# Patient Record
Sex: Male | Born: 1965 | Race: White | Hispanic: No | Marital: Single | Smoking: Current some day smoker
Health system: Southern US, Community
[De-identification: ages and names within clinical notes are randomized; demographics above are authoritative.]

## PROBLEM LIST (undated history)

## (undated) DIAGNOSIS — K5792 Diverticulitis of intestine, part unspecified, without perforation or abscess without bleeding: Secondary | ICD-10-CM

---

## 2007-06-27 HISTORY — PX: ABDOMINAL SURGERY: SHX537

## 2018-04-22 ENCOUNTER — Emergency Department (HOSPITAL_COMMUNITY): Payer: Self-pay

## 2018-04-22 ENCOUNTER — Inpatient Hospital Stay (HOSPITAL_COMMUNITY)
Admission: EM | Admit: 2018-04-22 | Discharge: 2018-04-24 | DRG: 392 | Discharge status: Against Medical Advice | Payer: Self-pay | Attending: Internal Medicine | Admitting: Internal Medicine

## 2018-04-22 ENCOUNTER — Encounter (HOSPITAL_COMMUNITY): Payer: Self-pay | Admitting: *Deleted

## 2018-04-22 ENCOUNTER — Other Ambulatory Visit: Payer: Self-pay

## 2018-04-22 DIAGNOSIS — M5430 Sciatica, unspecified side: Secondary | ICD-10-CM | POA: Diagnosis present

## 2018-04-22 DIAGNOSIS — F22 Delusional disorders: Secondary | ICD-10-CM | POA: Diagnosis present

## 2018-04-22 DIAGNOSIS — Z91018 Allergy to other foods: Secondary | ICD-10-CM

## 2018-04-22 DIAGNOSIS — R739 Hyperglycemia, unspecified: Secondary | ICD-10-CM | POA: Diagnosis present

## 2018-04-22 DIAGNOSIS — K630 Abscess of intestine: Secondary | ICD-10-CM

## 2018-04-22 DIAGNOSIS — R319 Hematuria, unspecified: Secondary | ICD-10-CM | POA: Diagnosis present

## 2018-04-22 DIAGNOSIS — F17210 Nicotine dependence, cigarettes, uncomplicated: Secondary | ICD-10-CM | POA: Diagnosis present

## 2018-04-22 DIAGNOSIS — K5720 Diverticulitis of large intestine with perforation and abscess without bleeding: Principal | ICD-10-CM | POA: Diagnosis present

## 2018-04-22 DIAGNOSIS — Z9049 Acquired absence of other specified parts of digestive tract: Secondary | ICD-10-CM

## 2018-04-22 DIAGNOSIS — Z885 Allergy status to narcotic agent status: Secondary | ICD-10-CM

## 2018-04-22 DIAGNOSIS — F17200 Nicotine dependence, unspecified, uncomplicated: Secondary | ICD-10-CM | POA: Diagnosis present

## 2018-04-22 HISTORY — DX: Diverticulitis of intestine, part unspecified, without perforation or abscess without bleeding: K57.92

## 2018-04-22 LAB — URINALYSIS, ROUTINE W REFLEX MICROSCOPIC
BILIRUBIN URINE: NEGATIVE
GLUCOSE, UA: NEGATIVE mg/dL
KETONES UR: NEGATIVE mg/dL
LEUKOCYTES UA: NEGATIVE
Nitrite: NEGATIVE
PH: 5 (ref 5.0–8.0)
PROTEIN: NEGATIVE mg/dL
Specific Gravity, Urine: 1.021 (ref 1.005–1.030)

## 2018-04-22 LAB — COMPREHENSIVE METABOLIC PANEL
ALK PHOS: 70 U/L (ref 38–126)
ALT: 30 U/L (ref 0–44)
ANION GAP: 9 (ref 5–15)
AST: 22 U/L (ref 15–41)
Albumin: 3.7 g/dL (ref 3.5–5.0)
BUN: 13 mg/dL (ref 6–20)
CALCIUM: 9.4 mg/dL (ref 8.9–10.3)
CO2: 25 mmol/L (ref 22–32)
Chloride: 104 mmol/L (ref 98–111)
Creatinine, Ser: 0.91 mg/dL (ref 0.61–1.24)
GFR calc non Af Amer: >60 mL/min (ref >60)
Glucose, Bld: 112 mg/dL — ABNORMAL HIGH (ref 70–99)
Potassium: 3.6 mmol/L (ref 3.5–5.1)
SODIUM: 138 mmol/L (ref 135–145)
TOTAL PROTEIN: 7.1 g/dL (ref 6.5–8.1)
Total Bilirubin: 0.7 mg/dL (ref 0.3–1.2)

## 2018-04-22 LAB — RAPID URINE DRUG SCREEN, HOSP PERFORMED
Amphetamines: NOT DETECTED
Barbiturates: NOT DETECTED
Benzodiazepines: NOT DETECTED
Cocaine: NOT DETECTED
Opiates: NOT DETECTED
TETRAHYDROCANNABINOL: POSITIVE — AB

## 2018-04-22 LAB — CBC
HCT: 43 % (ref 39.0–52.0)
Hemoglobin: 14.1 g/dL (ref 13.0–17.0)
MCH: 30.7 pg (ref 26.0–34.0)
MCHC: 32.8 g/dL (ref 30.0–36.0)
MCV: 93.5 fL (ref 80.0–100.0)
PLATELETS: 287 10*3/uL (ref 150–400)
RBC: 4.6 MIL/uL (ref 4.22–5.81)
RDW: 12.5 % (ref 11.5–15.5)
WBC: 13.9 10*3/uL — ABNORMAL HIGH (ref 4.0–10.5)
nRBC: 0 % (ref 0.0–0.2)

## 2018-04-22 LAB — LIPASE, BLOOD: Lipase: 26 U/L (ref 11–51)

## 2018-04-22 LAB — POC OCCULT BLOOD, ED: Fecal Occult Bld: NEGATIVE

## 2018-04-22 MED ORDER — FENTANYL CITRATE (PF) 100 MCG/2ML IJ SOLN
50.0000 ug | Freq: Once | INTRAMUSCULAR | Status: AC
  Administered 2018-04-22: 50 ug via INTRAVENOUS
  Filled 2018-04-22: qty 2

## 2018-04-22 MED ORDER — PIPERACILLIN-TAZOBACTAM 3.375 G IVPB 30 MIN
3.3750 g | Freq: Once | INTRAVENOUS | Status: AC
  Administered 2018-04-22: 3.375 g via INTRAVENOUS
  Filled 2018-04-22: qty 50

## 2018-04-22 MED ORDER — PIPERACILLIN-TAZOBACTAM 3.375 G IVPB 30 MIN: 3.3750 g | Freq: Three times a day (TID) | INTRAVENOUS | Status: DC

## 2018-04-22 MED ORDER — IOHEXOL 300 MG/ML  SOLN
100.0000 mL | Freq: Once | INTRAMUSCULAR | Status: AC | PRN
  Administered 2018-04-22: 100 mL via INTRAVENOUS

## 2018-04-22 MED ORDER — LACTATED RINGERS IV SOLN
INTRAVENOUS | Status: DC
  Administered 2018-04-22 – 2018-04-24 (×4): via INTRAVENOUS

## 2018-04-22 MED ORDER — ONDANSETRON HCL 4 MG/2ML IJ SOLN
4.0000 mg | Freq: Four times a day (QID) | INTRAMUSCULAR | Status: DC | PRN
  Administered 2018-04-22 – 2018-04-23 (×2): 4 mg via INTRAVENOUS
  Filled 2018-04-22: qty 2

## 2018-04-22 MED ORDER — ONDANSETRON HCL 4 MG PO TABS: 4.0000 mg | ORAL_TABLET | Freq: Four times a day (QID) | ORAL | Status: DC | PRN

## 2018-04-22 MED ORDER — ONDANSETRON HCL 4 MG/2ML IJ SOLN
4.0000 mg | Freq: Once | INTRAMUSCULAR | Status: AC
  Administered 2018-04-22: 4 mg via INTRAVENOUS
  Filled 2018-04-22: qty 2

## 2018-04-22 MED ORDER — MORPHINE SULFATE (PF) 2 MG/ML IV SOLN
2.0000 mg | INTRAVENOUS | Status: DC | PRN
  Administered 2018-04-22 – 2018-04-23 (×6): 2 mg via INTRAVENOUS
  Filled 2018-04-22 (×7): qty 1

## 2018-04-22 MED ORDER — ACETAMINOPHEN 650 MG RE SUPP: 650.0000 mg | Freq: Four times a day (QID) | RECTAL | Status: DC | PRN

## 2018-04-22 MED ORDER — SODIUM CHLORIDE 0.9 % IV BOLUS
1000.0000 mL | Freq: Once | INTRAVENOUS | Status: AC
  Administered 2018-04-22: 1000 mL via INTRAVENOUS

## 2018-04-22 MED ORDER — ACETAMINOPHEN 325 MG PO TABS
650.0000 mg | ORAL_TABLET | Freq: Four times a day (QID) | ORAL | Status: DC | PRN
  Administered 2018-04-22: 650 mg via ORAL
  Filled 2018-04-22: qty 2

## 2018-04-22 MED ORDER — PIPERACILLIN-TAZOBACTAM 3.375 G IVPB
3.3750 g | Freq: Three times a day (TID) | INTRAVENOUS | Status: DC
  Administered 2018-04-22 – 2018-04-24 (×5): 3.375 g via INTRAVENOUS
  Filled 2018-04-22 (×6): qty 50

## 2018-04-22 NOTE — H&P (Addendum)
History and Physical   Deferred admission to surgery at this time - no known PMH   Jeffrey Harrell ZOX:[id number] DOB: 12-Mar-1966 DOA: 04/22/2018  PCP: Patient, No Pcp Per - last saw a doctor as a child Consultants:  None Patient coming from:  Home - lives with [place]; Jackey LogeMarina Gravel, [phone]  Chief Complaint:  abdominal pain  HPI: Jeffrey Harrell is a 52 y.o. male with no known past medical history presenting with abdominal pain.  Abdominal pain, hot/cold, "toxins getting into my blood stream from it.  Just diverticulitis basically.  When I drink water, it passes by it and I feel like I might pass out.  It have a high tolerance to pain... But THIS, this ain;'t nothing to play with.  It feels literally like someone's drilled a hole in there and pulled acid.  He had abdominal surgery 10 years ago "and they said it was a mistake" afterwards.  "Plus I was a donor, I don't know if that had anything to do with it.  They took my intestines and used it for someone else.  There was some other part, only 2% of folks have some connection, some other part like a small cucumber that aids in digestion and they took it too."  Abdominal pain started Thursday, but had intermittent pain for years.  He went to [place] a year ago and they wouldn't give him pain medicine, "wouldn't treat me right", wouldn't give him an antibiotic.  He reports that he almost died after discharge.  He is having RLQ pain.  Subjective fevers "because I eat a lot of stuff to keep the toxins at bay."  He changed herbs to prevent bleeding but then it hurt too.  He thinks his ex-wife had something to do with the prior surgery, since she worked for those doctors and then had a drink with them the night before and "they comped the surgery."  "And then she faked her death... And I got really upset... I don't know why they'd do that... She has a criminal record now."   ED Course:  Diverticular abscess.  Recurrent episodes but "afraid to go to  the doctor."  Had dark and bright red blood in stools, resolved since onset of symptoms 5 years ago.  ?h/o colectomy, ?sold his organs to his ex-wife.  Surgery prefers to consult only.  Review of Systems: As per HPI; otherwise review of systems reviewed and negative.   Ambulatory Status:  Ambulates without assistance  Past Medical History:  Diagnosis Date  . Diverticulitis     Past Surgical History:  Procedure Laterality Date  . ABDOMINAL SURGERY  2009    Social History   Socioeconomic History  . Marital status: Single    Spouse name: Not on file  . Number of children: Not on file  . Years of education: Not on file  . Highest education level: Not on file  Occupational History  . Not on file  Social Needs  . Financial resource strain: Not on file  . Food insecurity:    Worry: Not on file    Inability: Not on file  . Transportation needs:    Medical: Not on file    Non-medical: Not on file  Tobacco Use  . Smoking status: Current Some Day Smoker  . Smokeless tobacco: Never Used  Substance and Sexual Activity  . Alcohol use: Yes    Comment: occ  . Drug use: Yes    Types: Marijuana    Comment: last use  Saturday  . Sexual activity: Not on file  Lifestyle  . Physical activity:    Days per week: Not on file    Minutes per session: Not on file  . Stress: Not on file  Relationships  . Social connections:    Talks on phone: Not on file    Gets together: Not on file    Attends religious service: Not on file    Active member of club or organization: Not on file    Attends meetings of clubs or organizations: Not on file    Relationship status: Not on file  . Intimate partner violence:    Fear of current or ex partner: Not on file    Emotionally abused: Not on file    Physically abused: Not on file    Forced sexual activity: Not on file  Other Topics Concern  . Not on file  Social History Narrative  . Not on file    Allergies  Allergen Reactions  . Horseradish  [Armoracia Rusticana Ext (Horseradish)] Shortness Of Breath and Rash  . Hydrocodone     History reviewed. No pertinent family history.  Prior to Admission medications   Not on File    Physical Exam: Vitals:   04/22/18 1411 04/22/18 1412 04/22/18 1500 04/22/18 1515  BP:   123/83 123/82  Pulse:   72 73  Resp:      Temp: 99.1 F (37.3 C) 99.4 F (37.4 C)    TempSrc: Oral Rectal    SpO2:   100% 97%     General: Appears calm and comfortable and is NAD Eyes:  PERRL, EOMI, normal lids, iris ENT:  grossly normal hearing, lips & tongue, mmm Neck:  no LAD, masses or thyromegaly Cardiovascular:  RRR, no m/r/g. No LE edema.  Respiratory:   CTA bilaterally with no wheezes/rales/rhonchi.  Normal respiratory effort. Abdomen:  soft, NT, ND, NABS Skin:  no rash or induration seen on limited exam Musculoskeletal:  grossly normal tone BUE/BLE, good ROM, no bony abnormality Lower extremity:  No LE edema.  Limited foot exam with no ulcerations.  2+ distal pulses. Psychiatric:  grossly normal mood and affect, speech fluent and appropriate but somewhat delusional, AOx3 Neurologic:  CN 2-12 grossly intact, moves all extremities in coordinated fashion, sensation intact    Radiological Exams on Admission: Ct Abdomen Pelvis W Contrast  Result Date: 04/22/2018 CLINICAL DATA:  Lower abdominal pain, nausea, vomiting. Occasional bloody stool. EXAM: CT ABDOMEN AND PELVIS WITH CONTRAST TECHNIQUE: Multidetector CT imaging of the abdomen and pelvis was performed using the standard protocol following bolus administration of intravenous contrast. CONTRAST:  OMNIPAQUE IOHEXOL 300 MG/ML  SOLN COMPARISON:  None. FINDINGS: Lower chest: Lung bases are clear. No effusions. Heart is normal size. Hepatobiliary: Scattered low-density lesions throughout the liver most compatible with cysts. No biliary duct dilatation. Gallbladder unremarkable. Pancreas: No focal abnormality or ductal dilatation. Spleen: No focal  abnormality.  Normal size. Adrenals/Urinary Tract: No adrenal abnormality. No focal renal abnormality. No stones or hydronephrosis. Urinary bladder is unremarkable. Stomach/Bowel: Sigmoid diverticulosis. Wall thickening throughout the sigmoid colon. There is inflammation and focal gas and fluid collection along the right lateral sigmoid wall compatible with active diverticulitis with associated abscess. This measures 4.7 x 2.6 cm on image 58 and may extend into the right lateral sigmoid colon wall. Mildly prominent small bowel loops in the left abdomen, likely focal ileus. Stomach unremarkable. Vascular/Lymphatic: No evidence of aneurysm or adenopathy. Reproductive: No visible focal abnormality. Other: No free  fluid or free air. Musculoskeletal: No acute bony abnormality. IMPRESSION: Diffuse sigmoid diverticulosis with changes of active diverticulitis. There is a gas and fluid collection noted along the right lateral sigmoid wall extending into the right lower quadrant measuring 4.7 x 2.6 cm compatible with diverticular abscess. Electronically Signed   By: Charlett Nose M.D.   On: 04/22/2018 10:40    EKG: not done   Labs on Admission: I have personally reviewed the available labs and imaging studies at the time of the admission.  Pertinent labs:   Glucose 112 CMP otherwise WNL WBC 13.9 CBC otherwise WNL UA: large Hgb, rare bacteria, otherwise WNL Heme negative  Assessment/Plan Principal Problem:   Colonic diverticular abscess Active Problems:   Hyperglycemia   Hematuria   Diverticulitis with abscess -Patient's symptoms are c/w diverticulitis and his CT supports this as a diagnosis -Unfortunately, he appears to have a large abscess in the RLQ -His only SIRS criteria is leukocytosis (13.9) -IR has been consulted to attempt to drain the abscess tomorrow -Surgery has been consulted and notes that if he fails conservative management he would likely need colectomy and colostomy -For now, will  give bowel rest, IVF, pain medication with morphine, nausea medication with Zofran, and treat with Zosyn for intraabdominal infection -Will attempt to obtain prior records from [facility]  Hyperglycemia -May be stress response -Will follow with fasting AM labs -It is unlikely that he will need acute or chronic treatment for this issue at this time  Hematuria -Uncertain etiology -Suggest outpatient PCP f/u (needs PCP)  DVT prophylaxis:  SCDs Code Status:  Full - confirmed with patient/family Family Communication: Steffanie Rainwater present throughout evaluation  Disposition Plan:  Home once clinically improved Consults called: Surgery; IR  Admission status: Admit - It is my clinical opinion that admission to INPATIENT is reasonable and necessary because of the expectation that this patient will require hospital care that crosses at least 2 midnights to treat this condition based on the medical complexity of the problems presented.  Given the aforementioned information, the predictability of an adverse outcome is felt to be significant.    Jonah Blue MD [facility]  If note is complete, please contact covering daytime or nighttime physician. [email or web address] Password [email or web address]  04/22/2018, 6:18 PM

## 2018-04-22 NOTE — Consult Note (Signed)
Chief Complaint: Patient was seen in consultation today for diverticular abscess  Referring Physician(s): CCS  Supervising Physician: Ruel Favors  Patient Status: [facility] - In-pt  History of Present Illness: Jeffrey Harrell is a 52 y.o. male with past medical history of diverticulitis with recurrent symptoms over the past 5-10 years.  Patient presented to [facility] ED with abdominal pain which has been present since Wednesday night.  He tried treating at home, however pain worsened and he presented for further evaluation.    CT Abdomen/Pelvis 04/22/18 showed: Diffuse sigmoid diverticulosis with changes of active diverticulitis. There is a gas and fluid collection noted along the right lateral sigmoid wall extending into the right lower quadrant measuring 4.7 x 2.6 cm compatible with diverticular abscess.  IR consulted for aspiration and drainage at the request of general surgery.   Past Medical History:  Diagnosis Date  . Diverticulitis     Past Surgical History:  Procedure Laterality Date  . ABDOMINAL SURGERY  2009    Allergies: Horseradish [armoracia rusticana ext (horseradish)] and Hydrocodone  Medications: Prior to Admission medications   Medication Sig Start Date End Date Taking? Authorizing Provider  ibuprofen (ADVIL,MOTRIN) 200 MG tablet Take 600 mg by mouth every 6 (six) hours as needed for moderate pain.   Yes [provider]  OIL OF OREGANO PO Take 10 drops by mouth as needed (for infection).   Yes [provider]     History reviewed. No pertinent family history.  Social History   Socioeconomic History  . Marital status: Single    Spouse name: Not on file  . Number of children: Not on file  . Years of education: Not on file  . Highest education level: Not on file  Occupational History  . Not on file  Social Needs  . Financial resource strain: Not on file  . Food insecurity:    Worry: Not on file    Inability: Not on file  .  Transportation needs:    Medical: Not on file    Non-medical: Not on file  Tobacco Use  . Smoking status: Current Some Day Smoker  . Smokeless tobacco: Never Used  Substance and Sexual Activity  . Alcohol use: Yes    Comment: occ  . Drug use: Yes    Types: Marijuana    Comment: last use Saturday  . Sexual activity: Not on file  Lifestyle  . Physical activity:    Days per week: Not on file    Minutes per session: Not on file  . Stress: Not on file  Relationships  . Social connections:    Talks on phone: Not on file    Gets together: Not on file    Attends religious service: Not on file    Active member of club or organization: Not on file    Attends meetings of clubs or organizations: Not on file    Relationship status: Not on file  Other Topics Concern  . Not on file  Social History Narrative  . Not on file     Review of Systems: A 12 point ROS discussed and pertinent positives are indicated in the HPI above.  All other systems are negative.  Review of Systems  Constitutional: Negative for fatigue and fever.  Respiratory: Negative for cough and shortness of breath.   Cardiovascular: Negative for chest pain.  Gastrointestinal: Positive for abdominal pain and nausea. Negative for diarrhea and vomiting.  Genitourinary: Negative for dysuria.  Musculoskeletal: Negative for back pain.  Psychiatric/Behavioral: Negative for behavioral problems and confusion.    Vital Signs: BP 129/86   Pulse 73   Temp 99.4 F (37.4 C) (Rectal)   Resp 18   SpO2 100%   Physical Exam  Constitutional: He is oriented to person, place, and time. He appears well-developed. No distress.  Cardiovascular: Normal rate, regular rhythm and normal heart sounds.  Pulmonary/Chest: Effort normal and breath sounds normal.  Abdominal: Soft. He exhibits no distension. There is tenderness (RLQ).  Neurological: He is alert and oriented to person, place, and time.  Skin: Skin is warm and dry.    Psychiatric: He has a normal mood and affect. His behavior is normal. Judgment and thought content normal.  Nursing note and vitals reviewed.    MD Evaluation Airway: WNL Heart: WNL Abdomen: WNL Chest/ Lungs: WNL ASA  Classification: 3 Mallampati/Airway Score: One   Imaging: Ct Abdomen Pelvis W Contrast  Result Date: 04/22/2018 CLINICAL DATA:  Lower abdominal pain, nausea, vomiting. Occasional bloody stool. EXAM: CT ABDOMEN AND PELVIS WITH CONTRAST TECHNIQUE: Multidetector CT imaging of the abdomen and pelvis was performed using the standard protocol following bolus administration of intravenous contrast. CONTRAST:  OMNIPAQUE IOHEXOL 300 MG/ML  SOLN COMPARISON:  None. FINDINGS: Lower chest: Lung bases are clear. No effusions. Heart is normal size. Hepatobiliary: Scattered low-density lesions throughout the liver most compatible with cysts. No biliary duct dilatation. Gallbladder unremarkable. Pancreas: No focal abnormality or ductal dilatation. Spleen: No focal abnormality.  Normal size. Adrenals/Urinary Tract: No adrenal abnormality. No focal renal abnormality. No stones or hydronephrosis. Urinary bladder is unremarkable. Stomach/Bowel: Sigmoid diverticulosis. Wall thickening throughout the sigmoid colon. There is inflammation and focal gas and fluid collection along the right lateral sigmoid wall compatible with active diverticulitis with associated abscess. This measures 4.7 x 2.6 cm on image 58 and may extend into the right lateral sigmoid colon wall. Mildly prominent small bowel loops in the left abdomen, likely focal ileus. Stomach unremarkable. Vascular/Lymphatic: No evidence of aneurysm or adenopathy. Reproductive: No visible focal abnormality. Other: No free fluid or free air. Musculoskeletal: No acute bony abnormality. IMPRESSION: Diffuse sigmoid diverticulosis with changes of active diverticulitis. There is a gas and fluid collection noted along the right lateral sigmoid wall  extending into the right lower quadrant measuring 4.7 x 2.6 cm compatible with diverticular abscess. Electronically Signed   By: Charlett Nose M.D.   On: 04/22/2018 10:40    Labs:  CBC: Recent Labs    04/22/18 0820  WBC 13.9*  HGB 14.1  HCT 43.0  PLT 287    COAGS: No results for input(s): INR, APTT in the last 8760 hours.  BMP: Recent Labs    04/22/18 0820  NA 138  K 3.6  CL 104  CO2 25  GLUCOSE 112*  BUN 13  CALCIUM 9.4  CREATININE 0.91  GFRNONAA >60  GFRAA >60    LIVER FUNCTION TESTS: Recent Labs    04/22/18 0820  BILITOT 0.7  AST 22  ALT 30  ALKPHOS 70  PROT 7.1  ALBUMIN 3.7    TUMOR MARKERS: No results for input(s): AFPTM, CEA, CA199, CHROMGRNA in the last 8760 hours.  Assessment and Plan: Diverticular abscess Patient admitted with abdominal pain.  CT Abdomen/Pelvis shows likely developing abscess.  IR consulted for aspiration and possible drainage.  Case reviewed by Dr. Miles Costain who notes aspiration may be possible, drainage unlikely given size of collection currently.  Patient to be NPO after midnight tonight for aspiration tomorrow. If collection is  large enough a drain may be left behind. This was discussed with the patient.   Risks and benefits discussed with the patient including bleeding, infection, damage to adjacent structures, bowel perforation/fistula connection, and sepsis.  All of the patient's questions were answered, patient is agreeable to proceed. Consent signed and in chart.  Thank you for this interesting consult.  I greatly enjoyed meeting Aras Albarran and look forward to participating in their care.  A copy of this report was sent to the requesting provider on this date.  Electronically Signed: Hoyt Koch, PA 04/22/2018, 2:37 PM   I spent a total of 40 Minutes    in face to face in clinical consultation, greater than 50% of which was counseling/coordinating care for diverticular abscess.

## 2018-04-22 NOTE — ED Notes (Signed)
Patient transported to floor via EMT-Monique,RN

## 2018-04-22 NOTE — Consult Note (Signed)
Jeffrey Harrell 1965-12-19  [id number].    Requesting MD: Dr. Karmen Bongo Chief Complaint/Reason for Consult: diverticulitis with abscess  HPI:  This is a 52 year old white male with a history of what sounds like intermittent episodes of diverticulitis in the past.  He states that about 10 years ago he had surgery at [facility] for unknown reasons.  He feels that this was done in error.  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  We will try to obtain his records.  He states ever since this operation his diet has not been the same.  He feels like over the last 5 years or so he has had intermittent episodes of similar feeling pain as 2 today.  He states he has had some blood per rectum over the last several years.  He does admit to having hemorrhoids when he lifts heavy things at work.  He feels he is deceptively strong and can lift 200 to 300 pounds on his own which will cause hemorrhoids.  He has never had a colonoscopy or seen a gastroenterologist.  Last Sunday he ate salsa and feels like this is the source of his current problem.  He developed pain last Wednesday in the right lower quadrant.  He denies fevers as best as I can tell.  He admits to continuing to eat a regular diet with no issues.  He has tried multiple "natural remedies" along with oregano extract last night.  He felt this was likely making a difference in helping but his pain persistently worsened and therefore presented to the emergency department today for evaluation.  Upon arrival he has been noted to have a white blood cell count of 13,000.  He is currently afebrile.  He has a CT scan which reveals diverticulitis with an abscess.  Medical service has admitted the patient and we have been asked to see the patient for further evaluation and recommendations.  ROS: ROS: Please see HPI otherwise all other systems have been reviewed and are negative.  History  reviewed. No pertinent family history.  Past Medical History:  Diagnosis Date  . Diverticulitis     Past Surgical History:  Procedure Laterality Date  . ABDOMINAL SURGERY  2009    Social History:  reports that he has been smoking. He has never used smokeless tobacco. He reports that he drinks alcohol. He reports that he has current or past drug history. Drug: Marijuana.  Allergies:  Allergies  Allergen Reactions  . Horseradish [Armoracia Rusticana Ext (Horseradish)] Shortness Of Breath and Rash  . Hydrocodone      (Not in a hospital admission)   Physical Exam: Blood pressure 129/86, pulse 73, temperature 98.4 F (36.9 C), temperature source Oral, resp. rate 18, SpO2 100 %. General: WD, WN white male who is laying in bed in NAD HEENT: head is normocephalic, atraumatic.  Sclera are noninjected.  PERRL.  Ears and nose without any masses or lesions.  Mouth is pink and moist Heart: regular, rate, and rhythm.  Normal s1,s2. No obvious murmurs, gallops, or rubs noted.  Palpable radial and pedal pulses bilaterally Lungs: CTAB, no wheezes, rhonchi, or rales noted.  Respiratory effort nonlabored Abd: soft, tender in right lower quadrant, ND, +BS, no masses, hernias, or organomegaly.  Right lower quadrant oblique scar noted. MS: all 4 extremities are symmetrical with no cyanosis, clubbing, or edema. Skin: warm and dry with no masses, lesions, or rashes Psych: A&Ox3 but  with some grandiose ideas.   Results for orders placed or performed during the hospital encounter of 04/22/18 (from the past 48 hour(s))  Urinalysis, Routine w reflex microscopic     Status: Abnormal   Collection Time: 04/22/18  8:18 AM  Result Value Ref Range   Color, Urine YELLOW YELLOW   APPearance CLEAR CLEAR   Specific Gravity, Urine 1.021 1.005 - 1.030   pH 5.0 5.0 - 8.0   Glucose, UA NEGATIVE NEGATIVE mg/dL   Hgb urine dipstick LARGE (A) NEGATIVE   Bilirubin Urine NEGATIVE NEGATIVE   Ketones, ur NEGATIVE  NEGATIVE mg/dL   Protein, ur NEGATIVE NEGATIVE mg/dL   Nitrite NEGATIVE NEGATIVE   Leukocytes, UA NEGATIVE NEGATIVE   RBC / HPF 0-5 0 - 5 RBC/hpf   WBC, UA 0-5 0 - 5 WBC/hpf   Bacteria, UA RARE (A) NONE SEEN   Mucus PRESENT     Comment: Performed at [facility], [address] [place], [place] [address]  Lipase, blood     Status: None   Collection Time: 04/22/18  8:20 AM  Result Value Ref Range   Lipase 26 11 - 51 U/L    Comment: Performed at [facility] [address], [place], [place] [address]  Comprehensive metabolic panel     Status: Abnormal   Collection Time: 04/22/18  8:20 AM  Result Value Ref Range   Sodium 138 135 - 145 mmol/L   Potassium 3.6 3.5 - 5.1 mmol/L   Chloride 104 98 - 111 mmol/L   CO2 25 22 - 32 mmol/L   Glucose, Bld 112 (H) 70 - 99 mg/dL   BUN 13 6 - 20 mg/dL   Creatinine, Ser 0.91 0.61 - 1.24 mg/dL   Calcium 9.4 8.9 - 10.3 mg/dL   Total Protein 7.1 6.5 - 8.1 g/dL   Albumin 3.7 3.5 - 5.0 g/dL   AST 22 15 - 41 U/L   ALT 30 0 - 44 U/L   Alkaline Phosphatase 70 38 - 126 U/L   Total Bilirubin 0.7 0.3 - 1.2 mg/dL   GFR calc non Af Amer >60 >60 mL/min   GFR calc Af Amer >60 >60 mL/min    Comment: (NOTE) The eGFR has been calculated using the CKD EPI equation. This calculation has not been validated in all clinical situations. eGFR's persistently <60 mL/min signify possible Chronic Kidney Disease.    Anion gap 9 5 - 15    Comment: Performed at [facility] [address], [place],  [address]  CBC     Status: Abnormal   Collection Time: 04/22/18  8:20 AM  Result Value Ref Range   WBC 13.9 (H) 4.0 - 10.5 K/uL   RBC 4.60 4.22 - 5.81 MIL/uL   Hemoglobin 14.1 13.0 - 17.0 g/dL   HCT 43.0 39.0 - 52.0 %   MCV 93.5 80.0 - 100.0 fL   MCH 30.7 26.0 - 34.0 pg   MCHC 32.8 30.0 - 36.0 g/dL   RDW 12.5 11.5 - 15.5 %   Platelets 287 150 - 400 K/uL   nRBC 0.0 0.0 - 0.2 %    Comment: Performed at [facility], [place] [address],  [place],  [address]  POC occult blood, ED Provider will collect     Status: None   Collection Time: 04/22/18  9:50 AM  Result Value Ref Range   Fecal Occult Bld NEGATIVE NEGATIVE   Ct Abdomen Pelvis W Contrast  Result Date: 04/22/2018 CLINICAL DATA:  Lower abdominal pain, nausea, vomiting. Occasional bloody stool. EXAM: CT ABDOMEN AND PELVIS WITH CONTRAST TECHNIQUE: Multidetector CT imaging of the abdomen and pelvis was performed using the standard protocol following bolus administration of intravenous contrast. CONTRAST:  189m OMNIPAQUE IOHEXOL 300 MG/ML  SOLN COMPARISON:  None. FINDINGS: Lower chest: Lung bases are clear. No effusions. Heart is normal size. Hepatobiliary: Scattered low-density lesions throughout the liver most compatible with cysts. No biliary duct dilatation. Gallbladder unremarkable. Pancreas: No focal abnormality or ductal dilatation. Spleen: No focal abnormality.  Normal size. Adrenals/Urinary Tract: No adrenal abnormality. No focal renal abnormality. No stones or hydronephrosis. Urinary bladder is unremarkable. Stomach/Bowel: Sigmoid diverticulosis. Wall thickening throughout the sigmoid colon. There is inflammation and focal gas and fluid collection along the right lateral sigmoid wall compatible with active diverticulitis with associated abscess. This measures 4.7 x 2.6 cm on image 58 and may extend into the right lateral sigmoid colon wall. Mildly prominent small bowel loops in the left abdomen, likely focal ileus. Stomach unremarkable. Vascular/Lymphatic: No evidence of aneurysm or adenopathy. Reproductive: No visible focal abnormality. Other: No free fluid or free air. Musculoskeletal: No acute bony abnormality. IMPRESSION: Diffuse sigmoid diverticulosis with changes of active diverticulitis. There is a gas and fluid collection noted along the right lateral sigmoid wall extending into the right lower quadrant measuring 4.7 x 2.6 cm compatible with diverticular abscess.  Electronically Signed   By: KRolm BaptiseM.D.   On: 04/22/2018 10:40      Assessment/Plan Diverticulitis with abscess The patient currently appears to have diverticulitis with a 4.7 x 2.6 cm abscess.  On review of his imaging this appears smaller in fluid size than what is measured.  We will have interventional radiology review his imaging to determine if this is something that is drainable or not.  Otherwise recommend conservative management with bowel rest, IV fluids, IV antibiotic therapy, and plus or minus drain placement.  If the patient fails conservative management that he would likely need a laparotomy with colectomy and colostomy.  This has all been discussed with the patient and his fiance who is at his bedside.  They understand and are agreeable to proceed with the current plan that has been laid out before him.  [facility]  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  However, we will try to confirm this.  FEN -n.p.o./IVF VTE -SCDs/may have chemical prophylaxis from our standpoint ID -ZPra[facility] du [facility]ien [facility] 04/22/2018, 1:41 PM Pager: [phone]

## 2018-04-22 NOTE — ED Notes (Signed)
Patient transported to CT 

## 2018-04-22 NOTE — ED Provider Notes (Signed)
[facility] EMERGENCY DEPARTMENT Provider Note   CSN: [id number] Arrival date & time: 04/22/18  4098     History   Chief Complaint Chief Complaint  Patient presents with  . Abdominal Pain    HPI Jeffrey Harrell is a 52 y.o. male who presents to ED for evaluation of 5-day history of vomiting right lower quadrant abdominal pain, dark red and bright red rectal bleeding, nausea.  States that the rectal bleeding has gradually improved over 5 days.  He describes the pain as a pressure sensation which radiates to his back and causes worsening of his sciatica.  States that he has had blood in stool similar symptoms last week which resolved after 3 days and supportive treatment such as eating turmeric and oregano.  He reports history of similar symptoms intermittently over the past several years which usually resolve on their own.  Patient states that all of the symptoms began approximately 10 years ago after he had an unknown abdominal surgery.  States that he was seen at [facility] at the time and states that "they took out some of my intestines but they said everything was okay and they apologized, but I do not know what they took out or what else they did."  He believes that his flareups are secondary to changes in his diet.  States that his dad and brother have a history of diverticulitis but patient himself has not ever been formally diagnosed with this.  He has not been taking the mother medications to help with the symptoms.  No sick contacts with similar symptoms.  Denies any fever, dysuria, shortness of breath, recent travel, URI symptoms.  Reports occasional alcohol use and tobacco use.  Denies any other drug use.  HPI  History reviewed. No pertinent past medical history.  Patient Active Problem List   Diagnosis Date Noted  . Colonic diverticular abscess 04/22/2018    Past Surgical History:  Procedure Laterality Date  . ABDOMINAL SURGERY          Home  Medications    Prior to Admission medications   Medication Sig Start Date End Date Taking? Authorizing Provider  ibuprofen (ADVIL,MOTRIN) 200 MG tablet Take 600 mg by mouth every 6 (six) hours as needed for moderate pain.   Yes [provider]  OIL OF OREGANO PO Take 10 drops by mouth as needed (for infection).   Yes [provider]    Family History No family history on file.  Social History Social History   Tobacco Use  . Smoking status: Current Some Day Smoker  . Smokeless tobacco: Never Used  Substance Use Topics  . Alcohol use: Yes    Comment: occ  . Drug use: Never     Allergies   Horseradish [armoracia rusticana ext (horseradish)] and Hydrocodone   Review of Systems Review of Systems  Constitutional: Negative for appetite change, chills and fever.  HENT: Negative for ear pain, rhinorrhea, sneezing and sore throat.   Eyes: Negative for photophobia and visual disturbance.  Respiratory: Negative for cough, chest tightness, shortness of breath and wheezing.   Cardiovascular: Negative for chest pain and palpitations.  Gastrointestinal: Positive for abdominal pain, blood in stool, diarrhea and nausea. Negative for constipation and vomiting.  Genitourinary: Negative for dysuria, hematuria and urgency.  Musculoskeletal: Negative for myalgias.  Skin: Negative for rash.  Neurological: Negative for dizziness, weakness and light-headedness.     Physical Exam Updated Vital Signs BP 129/86   Pulse 73  Temp 98.4 F (36.9 C) (Oral)   Resp 18   SpO2 100%   Physical Exam  Constitutional: He appears well-developed and well-nourished. No distress.  HENT:  Head: Normocephalic and atraumatic.  Nose: Nose normal.  Eyes: Conjunctivae and EOM are normal. Left eye exhibits no discharge. No scleral icterus.  Neck: Normal range of motion. Neck supple.  Cardiovascular: Normal rate, regular rhythm, normal heart sounds and intact distal pulses. Exam reveals no  gallop and no friction rub.  No murmur heard. Pulmonary/Chest: Effort normal and breath sounds normal. No respiratory distress.  Abdominal: Soft. Bowel sounds are normal. He exhibits no distension. There is tenderness in the right lower quadrant. There is no rebound and no guarding.  Genitourinary: Rectal exam shows no internal hemorrhoid and no fissure.  Genitourinary Comments: RN present for rectal exam. Stool not grossly hemoccult positive.  Musculoskeletal: Normal range of motion. He exhibits no edema.  Neurological: He is alert. He exhibits normal muscle tone. Coordination normal.  Skin: Skin is warm and dry. No rash noted.  Psychiatric: He has a normal mood and affect.  Nursing note and vitals reviewed.    ED Treatments / Results  Labs (all labs ordered are listed, but only abnormal results are displayed) Labs Reviewed  COMPREHENSIVE METABOLIC PANEL - Abnormal; Notable for the following components:      Result Value   Glucose, Bld 112 (*)    All other components within normal limits  CBC - Abnormal; Notable for the following components:   WBC 13.9 (*)    All other components within normal limits  URINALYSIS, ROUTINE W REFLEX MICROSCOPIC - Abnormal; Notable for the following components:   Hgb urine dipstick LARGE (*)    Bacteria, UA RARE (*)    All other components within normal limits  LIPASE, BLOOD  POC OCCULT BLOOD, ED    EKG None  Radiology Ct Abdomen Pelvis W Contrast  Result Date: 04/22/2018 CLINICAL DATA:  Lower abdominal pain, nausea, vomiting. Occasional bloody stool. EXAM: CT ABDOMEN AND PELVIS WITH CONTRAST TECHNIQUE: Multidetector CT imaging of the abdomen and pelvis was performed using the standard protocol following bolus administration of intravenous contrast. CONTRAST:  OMNIPAQUE IOHEXOL 300 MG/ML  SOLN COMPARISON:  None. FINDINGS: Lower chest: Lung bases are clear. No effusions. Heart is normal size. Hepatobiliary: Scattered low-density lesions  throughout the liver most compatible with cysts. No biliary duct dilatation. Gallbladder unremarkable. Pancreas: No focal abnormality or ductal dilatation. Spleen: No focal abnormality.  Normal size. Adrenals/Urinary Tract: No adrenal abnormality. No focal renal abnormality. No stones or hydronephrosis. Urinary bladder is unremarkable. Stomach/Bowel: Sigmoid diverticulosis. Wall thickening throughout the sigmoid colon. There is inflammation and focal gas and fluid collection along the right lateral sigmoid wall compatible with active diverticulitis with associated abscess. This measures 4.7 x 2.6 cm on image 58 and may extend into the right lateral sigmoid colon wall. Mildly prominent small bowel loops in the left abdomen, likely focal ileus. Stomach unremarkable. Vascular/Lymphatic: No evidence of aneurysm or adenopathy. Reproductive: No visible focal abnormality. Other: No free fluid or free air. Musculoskeletal: No acute bony abnormality. IMPRESSION: Diffuse sigmoid diverticulosis with changes of active diverticulitis. There is a gas and fluid collection noted along the right lateral sigmoid wall extending into the right lower quadrant measuring 4.7 x 2.6 cm compatible with diverticular abscess. Electronically Signed   By: Charlett Nose M.D.   On: 04/22/2018 10:40    Procedures Procedures (including critical care time)  Medications Ordered in ED Medications  sodium chloride 0.9 % bolus 1,000 mL (0 mLs Intravenous Stopped 04/22/18 1029)  ondansetron (ZOFRAN) injection 4 mg (4 mg Intravenous Given 04/22/18 0929)  fentaNYL (SUBLIMAZE) injection 50 mcg (50 mcg Intravenous Given 04/22/18 0929)  iohexol (OMNIPAQUE) 300 MG/ML solution 100 mL (100 mLs Intravenous Contrast Given 04/22/18 1035)  piperacillin-tazobactam (ZOSYN) IVPB 3.375 g (0 g Intravenous Stopped 04/22/18 1134)  fentaNYL (SUBLIMAZE) injection 50 mcg (50 mcg Intravenous Given 04/22/18 1101)     Initial Impression / Assessment and Plan / ED  Course  I have reviewed the triage vital signs and the nursing notes.  Pertinent labs & imaging results that were available during my care of the patient were reviewed by me and considered in my medical decision making (see chart for details).  Clinical Course as of Apr 22 1213  Mon Apr 22, 2018  1125 Hospitalist recommends that we call general surgery for admission.   [HK]  1146 Consulted surgery.  They will come and evaluate the patient after the case she is in right now.   [HK]  1156 OR nurse states Dr. Luisa Hart would like hospitalist to admit patient and they be used as consult. Will reconsult Dr. Ophelia Charter.   [HK]    Clinical Course User Index [HK] Dietrich Pates, PA-C    52 year old male presents to ED for a 5-day history of vomiting, right lower quadrant abdominal pain, dark red and bright red rectal bleeding and nausea.  States that he has had intermittent symptoms for the past several years which usually resolved with supportive measures at home such as turmeric or oregano.  However, he states that this particular episode has persisted with worsening pain.  He believes it is due to diverticulitis although he has never been formally diagnosed with this.  He assumes this because of his family history.  States that the rectal bleeding has improved in the past 5 days.  He does report history of a prior abdominal surgery, where he says portions of his intestines were taken out and possibly sold.  However, I do not find any documentation of what occurred during the surgery or if it occurred.  Nevertheless, patient has tenderness palpation of the right lower quadrant without rebound or guarding noted.  He is afebrile.  Lab work significant for leukocytosis of 13.9, lipase, CMP unremarkable.  Urinalysis unremarkable.  Stool is Hemoccult negative.  CT of the abdomen pelvis shows diverticulitis with a 4 x 2 cm diverticular abscess.  Will order antibiotics, consult for admission.  Portions of this note  were generated with Scientist, clinical (histocompatibility and immunogenetics). Dictation errors may occur despite best attempts at proofreading.  Final Clinical Impressions(s) / ED Diagnoses   Final diagnoses:  Intestinal diverticular abscess    ED Discharge Orders    None       Dietrich Pates, PA-C 04/22/18 1215    Pricilla Loveless, MD 04/22/18 1520

## 2018-04-22 NOTE — ED Triage Notes (Signed)
Pt is here with lower abdominal pain and thinks he may have diverticulosis. Pt states vomiting and states that he has lower abdominal pain then will have bright red and dark red rectal bleeding.

## 2018-04-23 ENCOUNTER — Other Ambulatory Visit: Payer: Self-pay

## 2018-04-23 ENCOUNTER — Inpatient Hospital Stay (HOSPITAL_COMMUNITY): Payer: Self-pay

## 2018-04-23 LAB — CBC
HCT: 39.1 % (ref 39.0–52.0)
Hemoglobin: 12.6 g/dL — ABNORMAL LOW (ref 13.0–17.0)
MCH: 29.6 pg (ref 26.0–34.0)
MCHC: 32.2 g/dL (ref 30.0–36.0)
MCV: 92 fL (ref 80.0–100.0)
NRBC: 0 % (ref 0.0–0.2)
Platelets: 253 10*3/uL (ref 150–400)
RBC: 4.25 MIL/uL (ref 4.22–5.81)
RDW: 12.2 % (ref 11.5–15.5)
WBC: 13.2 10*3/uL — AB (ref 4.0–10.5)

## 2018-04-23 LAB — BASIC METABOLIC PANEL
ANION GAP: 10 (ref 5–15)
BUN: 12 mg/dL (ref 6–20)
CO2: 26 mmol/L (ref 22–32)
CREATININE: 0.89 mg/dL (ref 0.61–1.24)
Calcium: 9.1 mg/dL (ref 8.9–10.3)
Chloride: 101 mmol/L (ref 98–111)
GFR calc non Af Amer: >60 mL/min (ref >60)
Glucose, Bld: 95 mg/dL (ref 70–99)
POTASSIUM: 3.9 mmol/L (ref 3.5–5.1)
SODIUM: 137 mmol/L (ref 135–145)

## 2018-04-23 LAB — PROTIME-INR
INR: 1.12
Prothrombin Time: 14.3 seconds (ref 11.4–15.2)

## 2018-04-23 LAB — HIV ANTIBODY (ROUTINE TESTING W REFLEX): HIV Screen 4th Generation wRfx: NONREACTIVE

## 2018-04-23 MED ORDER — ONDANSETRON HCL 4 MG/2ML IJ SOLN
INTRAMUSCULAR | Status: AC
  Filled 2018-04-23: qty 2

## 2018-04-23 MED ORDER — MIDAZOLAM HCL 2 MG/2ML IJ SOLN
INTRAMUSCULAR | Status: AC
  Filled 2018-04-23: qty 4

## 2018-04-23 MED ORDER — MIDAZOLAM HCL 2 MG/2ML IJ SOLN
INTRAMUSCULAR | Status: AC | PRN
  Administered 2018-04-23: 1 mg via INTRAVENOUS

## 2018-04-23 MED ORDER — LIDOCAINE HCL 1 % IJ SOLN
INTRAMUSCULAR | Status: AC
  Filled 2018-04-23: qty 20

## 2018-04-23 MED ORDER — PROMETHAZINE HCL 25 MG/ML IJ SOLN
12.5000 mg | Freq: Four times a day (QID) | INTRAMUSCULAR | Status: DC | PRN
  Administered 2018-04-23: 25 mg via INTRAVENOUS
  Filled 2018-04-23: qty 1

## 2018-04-23 MED ORDER — LORAZEPAM 2 MG/ML IJ SOLN
1.0000 mg | Freq: Once | INTRAMUSCULAR | Status: AC
  Administered 2018-04-23: 1 mg via INTRAVENOUS
  Filled 2018-04-23: qty 1

## 2018-04-23 MED ORDER — ENOXAPARIN SODIUM 40 MG/0.4ML ~~LOC~~ SOLN
40.0000 mg | SUBCUTANEOUS | Status: DC
  Administered 2018-04-24: 40 mg via SUBCUTANEOUS
  Filled 2018-04-23: qty 0.4

## 2018-04-23 MED ORDER — FENTANYL CITRATE (PF) 100 MCG/2ML IJ SOLN
INTRAMUSCULAR | Status: AC
  Filled 2018-04-23: qty 4

## 2018-04-23 MED ORDER — FENTANYL CITRATE (PF) 100 MCG/2ML IJ SOLN
INTRAMUSCULAR | Status: AC | PRN
  Administered 2018-04-23: 25 ug via INTRAVENOUS
  Administered 2018-04-23: 50 ug via INTRAVENOUS

## 2018-04-23 MED ORDER — LORAZEPAM 1 MG PO TABS
1.0000 mg | ORAL_TABLET | Freq: Once | ORAL | Status: AC
  Administered 2018-04-23: 1 mg via ORAL
  Filled 2018-04-23: qty 1

## 2018-04-23 NOTE — Sedation Documentation (Signed)
Patient vomited x1 in CT, bile noted. Zofran given by this RN per [facility] order. Patient states he feels better at this time. VSS.

## 2018-04-23 NOTE — Progress Notes (Signed)
PROGRESS NOTE                                                                                                                                                                                                             Patient Demographics:    Jeffrey Harrell, is a 52 y.o. male, DOB - [birth year], ZOX:[id number]  Admit date - 04/22/2018   Admitting Physician Jonah Blue, MD  Outpatient Primary MD for the patient is Patient, No Pcp Per  LOS - 1   Chief Complaint  Patient presents with  . Abdominal Pain       Brief Narrative   52 y.o. male with no known past medical history presenting with abdominal pain.  CT abdomen pelvis significant for diverticulitis, with abscess formation, seen by general surgery, had CT-guided aspiration 04/23/2018 by IR.     Subjective:    Jeffrey Harrell today reports some headache, denies any chest pain, reports some abdominal pain and nausea,    Assessment  & Plan :    Principal Problem:   Colonic diverticular abscess Active Problems:   Hyperglycemia   Hematuria   Diverticulitis with abscess -Patient's symptoms are c/w diverticulitis and his CT supports this as a diagnosis -Unfortunately, he appears to have a large abscess in the RLQ -Surgery has been consulted, the recommendation has been made for IR drainage, they have been consulted, they have perform CT aspiration of small abscess of the sigmoid colon, for now continue with IV Zosyn, follow on intraoperative cultures and blood cultures, adjust antibiotics as needed . -Started on clear liquid diet, advance as tolerated per surgery recommendation .   Hyperglycemia -May be stress response, no recurrence   DVT prophylaxis:  SCDs   Code Status : full  Family Communication  : family at bedside  Disposition Plan  : **home  Consults  :  IR, general surgery  Procedures  :  CT aspiration of small abscess at the sigmoid colon, likely partially  intra-mural. ~10-12cc of frankly purulent material aspirated for cx by Dr. Shella Spearing 04/23/2018  DVT Prophylaxis  : Lovenox to be started tomorrow  Lab Results  Component Value Date   PLT 253 04/23/2018    Antibiotics  :   Anti-infectives (From admission, onward)   Start     Dose/Rate Route Frequency Ordered Stop   04/22/18 1700  piperacillin-tazobactam (ZOSYN) IVPB 3.375 g     3.375 g 12.5 mL/hr over 240 Minutes Intravenous Every 8 hours 04/22/18 1256     04/22/18 1600  piperacillin-tazobactam (ZOSYN) IVPB 3.375 g  Status:  Discontinued     3.375 g 100 mL/hr over 30 Minutes Intravenous Every 8 hours 04/22/18 1250 04/22/18 1256   04/22/18 1100  piperacillin-tazobactam (ZOSYN) IVPB 3.375 g     3.375 g 100 mL/hr over 30 Minutes Intravenous  Once 04/22/18 1055 04/22/18 1134        Objective:   Vitals:   04/23/18 0955 04/23/18 1000 04/23/18 1015 04/23/18 1437  BP: 133/89 139/87 (!) 145/102 125/84  Pulse: 81 88 87 86  Resp: (!) 21 17 16 18   Temp:    99.9 F (37.7 C)  TempSrc:    Oral  SpO2: 100% 99% 100% 100%  Weight:      Height:        Wt Readings from Last 3 Encounters:  04/22/18 73.3 kg     Intake/Output Summary (Last 24 hours) at 04/23/2018 1724 Last data filed at 04/23/2018 1529 Gross per 24 hour  Intake 2078.62 ml  Output -  Net 2078.62 ml     Physical Exam  Awake Alert, Oriented X 3, No new F.N deficits, does have some delusional behavior Symmetrical Chest wall movement, Good air movement bilaterally, CTAB RRR,No Gallops,Rubs or new Murmurs, No Parasternal Heave +ve B.Sounds, Abd Soft, abdomen tenderness, mainly in the left side, no rebound - guarding or rigidity. No Cyanosis, Clubbing or edema, No new Rash or bruise      Data Review:    CBC Recent Labs  Lab 04/22/18 0820 04/23/18 0549  WBC 13.9* 13.2*  HGB 14.1 12.6*  HCT 43.0 39.1  PLT 287 253  MCV 93.5 92.0  MCH 30.7 29.6  MCHC 32.8 32.2  RDW 12.5 12.2    Chemistries  Recent Labs    Lab 04/22/18 0820 04/23/18 0549  NA 138 137  K 3.6 3.9  CL 104 101  CO2 25 26  GLUCOSE 112* 95  BUN 13 12  CREATININE 0.91 0.89  CALCIUM 9.4 9.1  AST 22  --   ALT 30  --   ALKPHOS 70  --   BILITOT 0.7  --    ------------------------------------------------------------------------------------------------------------------ No results for input(s): CHOL, HDL, LDLCALC, TRIG, CHOLHDL, LDLDIRECT in the last 72 hours.  No results found for: HGBA1C ------------------------------------------------------------------------------------------------------------------ No results for input(s): TSH, T4TOTAL, T3FREE, THYROIDAB in the last 72 hours.  Invalid input(s): FREET3 ------------------------------------------------------------------------------------------------------------------ No results for input(s): VITAMINB12, FOLATE, FERRITIN, TIBC, IRON, RETICCTPCT in the last 72 hours.  Coagulation profile Recent Labs  Lab 04/23/18 0549  INR 1.12    No results for input(s): DDIMER in the last 72 hours.  Cardiac Enzymes No results for input(s): CKMB, TROPONINI, MYOGLOBIN in the last 168 hours.  Invalid input(s): CK ------------------------------------------------------------------------------------------------------------------ No results found for: BNP  Inpatient Medications  Scheduled Meds: . fentaNYL      . lidocaine      . midazolam      . ondansetron       Continuous Infusions: . lactated ringers 100 mL/hr at 04/23/18 1529  . piperacillin-tazobactam (ZOSYN)  IV 3.375 g (04/23/18 1631)   PRN Meds:.acetaminophen **OR** acetaminophen, morphine injection, ondansetron **OR** ondansetron (ZOFRAN) IV, promethazine  Micro Results Recent Results (from the past 240 hour(s))  Aerobic/Anaerobic Culture (surgical/deep wound)     Status: None (Preliminary result)   Collection Time: 04/23/18 10:34 AM  Result Value Ref Range Status   Specimen Description ABSCESS ABDOMEN  Final    Special Requests NONE  Final   Gram Stain   Final    ABUNDANT WBC PRESENT, PREDOMINANTLY PMN ABUNDANT GRAM POSITIVE COCCI FEW GRAM POSITIVE RODS FEW GRAM NEGATIVE RODS Performed at [facility], [address] [place], [place] [address]    Culture PENDING  Incomplete   Report Status PENDING  Incomplete    Radiology Reports Ct Abdomen Pelvis W Contrast  Result Date: 04/22/2018 CLINICAL DATA:  Lower abdominal pain, nausea, vomiting. Occasional bloody stool. EXAM: CT ABDOMEN AND PELVIS WITH CONTRAST TECHNIQUE: Multidetector CT imaging of the abdomen and pelvis was performed using the standard protocol following bolus administration of intravenous contrast. CONTRAST:  OMNIPAQUE IOHEXOL 300 MG/ML  SOLN COMPARISON:  None. FINDINGS: Lower chest: Lung bases are clear. No effusions. Heart is normal size. Hepatobiliary: Scattered low-density lesions throughout the liver most compatible with cysts. No biliary duct dilatation. Gallbladder unremarkable. Pancreas: No focal abnormality or ductal dilatation. Spleen: No focal abnormality.  Normal size. Adrenals/Urinary Tract: No adrenal abnormality. No focal renal abnormality. No stones or hydronephrosis. Urinary bladder is unremarkable. Stomach/Bowel: Sigmoid diverticulosis. Wall thickening throughout the sigmoid colon. There is inflammation and focal gas and fluid collection along the right lateral sigmoid wall compatible with active diverticulitis with associated abscess. This measures 4.7 x 2.6 cm on image 58 and may extend into the right lateral sigmoid colon wall. Mildly prominent small bowel loops in the left abdomen, likely focal ileus. Stomach unremarkable. Vascular/Lymphatic: No evidence of aneurysm or adenopathy. Reproductive: No visible focal abnormality. Other: No free fluid or free air. Musculoskeletal: No acute bony abnormality. IMPRESSION: Diffuse sigmoid diverticulosis with changes of active diverticulitis. There is a gas and fluid  collection noted along the right lateral sigmoid wall extending into the right lower quadrant measuring 4.7 x 2.6 cm compatible with diverticular abscess. Electronically Signed   By: Charlett Nose M.D.   On: 04/22/2018 10:40   Ct Aspiration  Result Date: 04/23/2018 INDICATION: 52 year old male with a history of diverticular abscess. EXAM: CT-GUIDED ASPIRATION OF PERICOLONIC/INTRAMURAL ABSCESS MEDICATIONS: The patient is currently admitted to the hospital and receiving intravenous antibiotics. The antibiotics were administered within an appropriate time frame prior to the initiation of the procedure. ANESTHESIA/SEDATION: 1.0 mg IV Versed 75 mcg IV Fentanyl Moderate Sedation Time:  10 minutes The patient was continuously monitored during the procedure by the interventional radiology nurse under my direct supervision. COMPLICATIONS: None TECHNIQUE: Informed written consent was obtained from the patient after a thorough discussion of the procedural risks, benefits and alternatives. All questions were addressed. Maximal Sterile Barrier Technique was utilized including caps, mask, sterile gowns, sterile gloves, sterile drape, hand hygiene and skin antiseptic. A timeout was performed prior to the initiation of the procedure. PROCEDURE: The lower abdomen was prepped with chlorhexidine in a sterile fashion, and a sterile drape was applied covering the operative field. A sterile gown and sterile gloves were used for the procedure. Local anesthesia was provided with 1% Lidocaine. Once scout images were acquired, the patient is prepped and draped in the usual sterile fashion. 1% lidocaine was used for local anesthesia. Using CT guidance, 18 gauge needle was advanced to the pericolonic abscess within the pelvis. Aspiration approximately 10 cc of purulent material performed. Sample sent to lab. Needle was withdrawn and a sterile bandage was placed. Patient tolerated the procedure well and remained hemodynamically stable  throughout. No complications were encountered and no significant  blood loss. FINDINGS: CT demonstrates small gas and fluid collection associated with the wall of the sigmoid colon with associated phlegmon and. Approximately 10 cc of frankly purulent material aspirated. IMPRESSION: Status post CT-guided aspiration of small pericolonic/intramural abscess with sample sent the lab for analysis. Signed, Yvone Neu. Reyne Dumas, RPVI [facility] Radiology Electronically Signed   By: Gilmer Mor D.O.   On: 04/23/2018 10:28      Huey Bienenstock M.D on 04/23/2018 at 5:24 PM  Between 7am to 7pm - Pager - [phone]  After 7pm go to [email or web address] - password [facility]  [facility] -  Office  [phone]

## 2018-04-23 NOTE — Progress Notes (Signed)
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  He was Oriented to unit and fall education completed. Pt verbalizes understanding how to call for assistance. Skin intact.

## 2018-04-23 NOTE — Procedures (Signed)
Interventional Radiology Procedure Note  Procedure: CT aspiration of small abscess at the sigmoid colon, likely partially intra-mural. ~10-12cc of frankly purulent material aspirated for cx Complications: None Recommendations:  - follow up culture - Do not submerge for 7 days - Routine  care   Signed,  Yvone Neu. Loreta Ave, DO

## 2018-04-23 NOTE — Progress Notes (Signed)
Patient ID: Jeffrey Harrell, male   DOB: [birth year], 52 y.o.   MRN: [id number]       Subjective: Patient some nausea after his aspiration procedure.  Discussed medications can sometimes do that.  He refutes that and states its just because he hasn't eaten.  Pain seems a little better today.  Objective: Vital signs in last 24 hours: Temp:  [98.5 F (36.9 C)-99.4 F (37.4 C)] 98.5 F (36.9 C) (10/29 0545) Pulse Rate:  [72-88] 87 (10/29 1015) Resp:  [14-21] 16 (10/29 1015) BP: (123-152)/(80-102) 145/102 (10/29 1015) SpO2:  [97 %-100 %] 100 % (10/29 1015) Weight:  [73.3 kg] 73.3 kg (10/28 2055) Last BM Date: 04/22/18(Per pt)  Intake/Output from previous day: 10/28 0701 - 10/29 0700 In: 2457.5 [I.V.:1558.2; IV Piggyback:899.3] Out: -  Intake/Output this shift: No intake/output data recorded.  PE: Heart: regular Lungs: CTAB Abd: soft, less tender, most in central abd than in RLQ like yesterday, +BS, ND  Lab Results:  Recent Labs    04/22/18 0820 04/23/18 0549  WBC 13.9* 13.2*  HGB 14.1 12.6*  HCT 43.0 39.1  PLT 287 253   BMET Recent Labs    04/22/18 0820 04/23/18 0549  NA 138 137  K 3.6 3.9  CL 104 101  CO2 25 26  GLUCOSE 112* 95  BUN 13 12  CREATININE 0.91 0.89  CALCIUM 9.4 9.1   PT/INR Recent Labs    04/23/18 0549  LABPROT 14.3  INR 1.12   CMP     Component Value Date/Time   NA 137 04/23/2018 0549   K 3.9 04/23/2018 0549   CL 101 04/23/2018 0549   CO2 26 04/23/2018 0549   GLUCOSE 95 04/23/2018 0549   BUN 12 04/23/2018 0549   CREATININE 0.89 04/23/2018 0549   CALCIUM 9.1 04/23/2018 0549   PROT 7.1 04/22/2018 0820   ALBUMIN 3.7 04/22/2018 0820   AST 22 04/22/2018 0820   ALT 30 04/22/2018 0820   ALKPHOS 70 04/22/2018 0820   BILITOT 0.7 04/22/2018 0820   GFRNONAA >60 04/23/2018 0549   GFRAA >60 04/23/2018 0549   Lipase     Component Value Date/Time   LIPASE 26 04/22/2018 0820       Studies/Results: Ct Abdomen Pelvis W  Contrast  Result Date: 04/22/2018 CLINICAL DATA:  Lower abdominal pain, nausea, vomiting. Occasional bloody stool. EXAM: CT ABDOMEN AND PELVIS WITH CONTRAST TECHNIQUE: Multidetector CT imaging of the abdomen and pelvis was performed using the standard protocol following bolus administration of intravenous contrast. CONTRAST:  OMNIPAQUE IOHEXOL 300 MG/ML  SOLN COMPARISON:  None. FINDINGS: Lower chest: Lung bases are clear. No effusions. Heart is normal size. Hepatobiliary: Scattered low-density lesions throughout the liver most compatible with cysts. No biliary duct dilatation. Gallbladder unremarkable. Pancreas: No focal abnormality or ductal dilatation. Spleen: No focal abnormality.  Normal size. Adrenals/Urinary Tract: No adrenal abnormality. No focal renal abnormality. No stones or hydronephrosis. Urinary bladder is unremarkable. Stomach/Bowel: Sigmoid diverticulosis. Wall thickening throughout the sigmoid colon. There is inflammation and focal gas and fluid collection along the right lateral sigmoid wall compatible with active diverticulitis with associated abscess. This measures 4.7 x 2.6 cm on image 58 and may extend into the right lateral sigmoid colon wall. Mildly prominent small bowel loops in the left abdomen, likely focal ileus. Stomach unremarkable. Vascular/Lymphatic: No evidence of aneurysm or adenopathy. Reproductive: No visible focal abnormality. Other: No free fluid or free air. Musculoskeletal: No acute bony abnormality. IMPRESSION: Diffuse sigmoid diverticulosis with changes of active  diverticulitis. There is a gas and fluid collection noted along the right lateral sigmoid wall extending into the right lower quadrant measuring 4.7 x 2.6 cm compatible with diverticular abscess. Electronically Signed   By: Charlett Nose M.D.   On: 04/22/2018 10:40   Ct Aspiration  Result Date: 04/23/2018 INDICATION: 52 year old male with a history of diverticular abscess. EXAM: CT-GUIDED ASPIRATION OF  PERICOLONIC/INTRAMURAL ABSCESS MEDICATIONS: The patient is currently admitted to the hospital and receiving intravenous antibiotics. The antibiotics were administered within an appropriate time frame prior to the initiation of the procedure. ANESTHESIA/SEDATION: 1.0 mg IV Versed 75 mcg IV Fentanyl Moderate Sedation Time:  10 minutes The patient was continuously monitored during the procedure by the interventional radiology nurse under my direct supervision. COMPLICATIONS: None TECHNIQUE: Informed written consent was obtained from the patient after a thorough discussion of the procedural risks, benefits and alternatives. All questions were addressed. Maximal Sterile Barrier Technique was utilized including caps, mask, sterile gowns, sterile gloves, sterile drape, hand hygiene and skin antiseptic. A timeout was performed prior to the initiation of the procedure. PROCEDURE: The lower abdomen was prepped with chlorhexidine in a sterile fashion, and a sterile drape was applied covering the operative field. A sterile gown and sterile gloves were used for the procedure. Local anesthesia was provided with 1% Lidocaine. Once scout images were acquired, the patient is prepped and draped in the usual sterile fashion. 1% lidocaine was used for local anesthesia. Using CT guidance, 18 gauge needle was advanced to the pericolonic abscess within the pelvis. Aspiration approximately 10 cc of purulent material performed. Sample sent to lab. Needle was withdrawn and a sterile bandage was placed. Patient tolerated the procedure well and remained hemodynamically stable throughout. No complications were encountered and no significant blood loss. FINDINGS: CT demonstrates small gas and fluid collection associated with the wall of the sigmoid colon with associated phlegmon and. Approximately 10 cc of frankly purulent material aspirated. IMPRESSION: Status post CT-guided aspiration of small pericolonic/intramural abscess with sample sent  the lab for analysis. Signed, Yvone Neu. Reyne Dumas, RPVI [facility] Radiology Electronically Signed   By: Gilmer Mor D.O.   On: 04/23/2018 10:28    Anti-infectives: Anti-infectives (From admission, onward)   Start     Dose/Rate Route Frequency Ordered Stop   04/22/18 1700  piperacillin-tazobactam (ZOSYN) IVPB 3.375 g     3.375 g 12.5 mL/hr over 240 Minutes Intravenous Every 8 hours 04/22/18 1256     04/22/18 1600  piperacillin-tazobactam (ZOSYN) IVPB 3.375 g  Status:  Discontinued     3.375 g 100 mL/hr over 30 Minutes Intravenous Every 8 hours 04/22/18 1250 04/22/18 1256   04/22/18 1100  piperacillin-tazobactam (ZOSYN) IVPB 3.375 g     3.375 g 100 mL/hr over 30 Minutes Intravenous  Once 04/22/18 1055 04/22/18 1134       Assessment/Plan Diverticulitis with abscess -aspiration today of 10-12cc of purulent material from abscess -pain seems a little better today despite WBC stable at 13K.  Will try clear liquids -cont IV abx therapy  ? Paranoia/delusions  FEN -CLD/IVF VTE -SCDs/may have chemical prophylaxis from our standpoint ID -Zosyn   LOS: 1 day    Letha Cape , [facility] Surgery 04/23/2018, 11:20 AM Pager: [phone]

## 2018-04-24 DIAGNOSIS — K5720 Diverticulitis of large intestine with perforation and abscess without bleeding: Principal | ICD-10-CM

## 2018-04-24 LAB — BASIC METABOLIC PANEL
ANION GAP: 8 (ref 5–15)
BUN: 8 mg/dL (ref 6–20)
CHLORIDE: 100 mmol/L (ref 98–111)
CO2: 27 mmol/L (ref 22–32)
Calcium: 8.9 mg/dL (ref 8.9–10.3)
Creatinine, Ser: 0.85 mg/dL (ref 0.61–1.24)
GFR calc non Af Amer: >60 mL/min (ref >60)
GLUCOSE: 105 mg/dL — AB (ref 70–99)
POTASSIUM: 3.6 mmol/L (ref 3.5–5.1)
Sodium: 135 mmol/L (ref 135–145)

## 2018-04-24 LAB — CBC
HEMATOCRIT: 37.7 % — AB (ref 39.0–52.0)
Hemoglobin: 12.8 g/dL — ABNORMAL LOW (ref 13.0–17.0)
MCH: 30.4 pg (ref 26.0–34.0)
MCHC: 34 g/dL (ref 30.0–36.0)
MCV: 89.5 fL (ref 80.0–100.0)
Platelets: 236 10*3/uL (ref 150–400)
RBC: 4.21 MIL/uL — ABNORMAL LOW (ref 4.22–5.81)
RDW: 12 % (ref 11.5–15.5)
WBC: 10 10*3/uL (ref 4.0–10.5)
nRBC: 0 % (ref 0.0–0.2)

## 2018-04-24 MED ORDER — AMOXICILLIN-POT CLAVULANATE 875-125 MG PO TABS: 1.0000 | ORAL_TABLET | Freq: Two times a day (BID) | ORAL | 0 refills | Status: DC

## 2018-04-24 MED ORDER — SODIUM CHLORIDE 0.9 % IV SOLN
3.0000 g | Freq: Four times a day (QID) | INTRAVENOUS | Status: DC
  Administered 2018-04-24: 3 g via INTRAVENOUS
  Filled 2018-04-24 (×3): qty 3

## 2018-04-24 MED ORDER — AMOXICILLIN-POT CLAVULANATE 875-125 MG PO TABS: 1.0000 | ORAL_TABLET | Freq: Two times a day (BID) | ORAL | 0 refills | Status: AC

## 2018-04-24 MED ORDER — AMOXICILLIN-POT CLAVULANATE 875-125 MG PO TABS: 1.0000 | ORAL_TABLET | Freq: Two times a day (BID) | ORAL | Status: DC

## 2018-04-24 NOTE — Progress Notes (Signed)
Patient ID: Jeffrey Harrell, male   DOB: [birth year], 52 y.o.   MRN: [id number]       Subjective: Patient feels much better today.  No further nausea.  Eating clear liquids very well with no issues.  Objective: Vital signs in last 24 hours: Temp:  [97.8 F (36.6 C)-99.9 F (37.7 C)] 97.8 F (36.6 C) (10/30 0730) Pulse Rate:  [73-88] 73 (10/30 0730) Resp:  [15-18] 15 (10/30 0730) BP: (107-139)/(84-87) 129/86 (10/30 0730) SpO2:  [97 %-100 %] 97 % (10/30 0730) Last BM Date: 04/22/18  Intake/Output from previous day: 10/29 0701 - 10/30 0700 In: 420.7 [I.V.:420.4; IV Piggyback:0.2] Out: -  Intake/Output this shift: No intake/output data recorded.  PE: Heart: regular Lungs: CTAB Abd: soft, almost nontender, +BS, ND  Lab Results:  Recent Labs    04/23/18 0549 04/24/18 0335  WBC 13.2* 10.0  HGB 12.6* 12.8*  HCT 39.1 37.7*  PLT 253 236   BMET Recent Labs    04/23/18 0549 04/24/18 0335  NA 137 135  K 3.9 3.6  CL 101 100  CO2 26 27  GLUCOSE 95 105*  BUN 12 8  CREATININE 0.89 0.85  CALCIUM 9.1 8.9   PT/INR Recent Labs    04/23/18 0549  LABPROT 14.3  INR 1.12   CMP     Component Value Date/Time   NA 135 04/24/2018 0335   K 3.6 04/24/2018 0335   CL 100 04/24/2018 0335   CO2 27 04/24/2018 0335   GLUCOSE 105 (H) 04/24/2018 0335   BUN 8 04/24/2018 0335   CREATININE 0.85 04/24/2018 0335   CALCIUM 8.9 04/24/2018 0335   PROT 7.1 04/22/2018 0820   ALBUMIN 3.7 04/22/2018 0820   AST 22 04/22/2018 0820   ALT 30 04/22/2018 0820   ALKPHOS 70 04/22/2018 0820   BILITOT 0.7 04/22/2018 0820   GFRNONAA >60 04/24/2018 0335   GFRAA >60 04/24/2018 0335   Lipase     Component Value Date/Time   LIPASE 26 04/22/2018 0820       Studies/Results: Ct Abdomen Pelvis W Contrast  Result Date: 04/22/2018 CLINICAL DATA:  Lower abdominal pain, nausea, vomiting. Occasional bloody stool. EXAM: CT ABDOMEN AND PELVIS WITH CONTRAST TECHNIQUE: Multidetector CT imaging of the  abdomen and pelvis was performed using the standard protocol following bolus administration of intravenous contrast. CONTRAST:  OMNIPAQUE IOHEXOL 300 MG/ML  SOLN COMPARISON:  None. FINDINGS: Lower chest: Lung bases are clear. No effusions. Heart is normal size. Hepatobiliary: Scattered low-density lesions throughout the liver most compatible with cysts. No biliary duct dilatation. Gallbladder unremarkable. Pancreas: No focal abnormality or ductal dilatation. Spleen: No focal abnormality.  Normal size. Adrenals/Urinary Tract: No adrenal abnormality. No focal renal abnormality. No stones or hydronephrosis. Urinary bladder is unremarkable. Stomach/Bowel: Sigmoid diverticulosis. Wall thickening throughout the sigmoid colon. There is inflammation and focal gas and fluid collection along the right lateral sigmoid wall compatible with active diverticulitis with associated abscess. This measures 4.7 x 2.6 cm on image 58 and may extend into the right lateral sigmoid colon wall. Mildly prominent small bowel loops in the left abdomen, likely focal ileus. Stomach unremarkable. Vascular/Lymphatic: No evidence of aneurysm or adenopathy. Reproductive: No visible focal abnormality. Other: No free fluid or free air. Musculoskeletal: No acute bony abnormality. IMPRESSION: Diffuse sigmoid diverticulosis with changes of active diverticulitis. There is a gas and fluid collection noted along the right lateral sigmoid wall extending into the right lower quadrant measuring 4.7 x 2.6 cm compatible with diverticular abscess. Electronically Signed  By: Charlett Nose M.D.   On: 04/22/2018 10:40   Ct Aspiration  Result Date: 04/23/2018 INDICATION: 52 year old male with a history of diverticular abscess. EXAM: CT-GUIDED ASPIRATION OF PERICOLONIC/INTRAMURAL ABSCESS MEDICATIONS: The patient is currently admitted to the hospital and receiving intravenous antibiotics. The antibiotics were administered within an appropriate time frame  prior to the initiation of the procedure. ANESTHESIA/SEDATION: 1.0 mg IV Versed 75 mcg IV Fentanyl Moderate Sedation Time:  10 minutes The patient was continuously monitored during the procedure by the interventional radiology nurse under my direct supervision. COMPLICATIONS: None TECHNIQUE: Informed written consent was obtained from the patient after a thorough discussion of the procedural risks, benefits and alternatives. All questions were addressed. Maximal Sterile Barrier Technique was utilized including caps, mask, sterile gowns, sterile gloves, sterile drape, hand hygiene and skin antiseptic. A timeout was performed prior to the initiation of the procedure. PROCEDURE: The lower abdomen was prepped with chlorhexidine in a sterile fashion, and a sterile drape was applied covering the operative field. A sterile gown and sterile gloves were used for the procedure. Local anesthesia was provided with 1% Lidocaine. Once scout images were acquired, the patient is prepped and draped in the usual sterile fashion. 1% lidocaine was used for local anesthesia. Using CT guidance, 18 gauge needle was advanced to the pericolonic abscess within the pelvis. Aspiration approximately 10 cc of purulent material performed. Sample sent to lab. Needle was withdrawn and a sterile bandage was placed. Patient tolerated the procedure well and remained hemodynamically stable throughout. No complications were encountered and no significant blood loss. FINDINGS: CT demonstrates small gas and fluid collection associated with the wall of the sigmoid colon with associated phlegmon and. Approximately 10 cc of frankly purulent material aspirated. IMPRESSION: Status post CT-guided aspiration of small pericolonic/intramural abscess with sample sent the lab for analysis. Signed, Yvone Neu. Reyne Dumas, RPVI [facility] Electronically Signed   By: Gilmer Mor D.O.   On: 04/23/2018 10:28     Anti-infectives: Anti-infectives (From admission, onward)   Start     Dose/Rate Route Frequency Ordered Stop   04/24/18 1000  Ampicillin-Sulbactam (UNASYN) 3 g in sodium chloride 0.9 % 100 mL IVPB     3 g 200 mL/hr over 30 Minutes Intravenous Every 6 hours 04/24/18 0953     04/22/18 1700  piperacillin-tazobactam (ZOSYN) IVPB 3.375 g  Status:  Discontinued     3.375 g 12.5 mL/hr over 240 Minutes Intravenous Every 8 hours 04/22/18 1256 04/24/18 0953   04/22/18 1600  piperacillin-tazobactam (ZOSYN) IVPB 3.375 g  Status:  Discontinued     3.375 g 100 mL/hr over 30 Minutes Intravenous Every 8 hours 04/22/18 1250 04/22/18 1256   04/22/18 1100  piperacillin-tazobactam (ZOSYN) IVPB 3.375 g     3.375 g 100 mL/hr over 30 Minutes Intravenous  Once 04/22/18 1055 04/22/18 1134       Assessment/Plan Diverticulitis with abscess -aspiration  On 10-29 by IR of abscess -pain much better today.  Tolerating liquids.  Adv to low fiber diet -WBC normal, may transition to oral abx therapy -dietitian consult for low fiber and high fiber teaching - if tolerates well today, then can likely DC home tomorrow  ? Paranoia/delusions  FEN -low fiber/IVF VTE -SCDs/may have chemical prophylaxis from our standpoint ID -Zosyn   LOS: 2 days    Letha Cape , [facility] Surgery 04/24/2018, 10:28 AM Pager: [phone]

## 2018-04-24 NOTE — Progress Notes (Signed)
Informed by RN that the patient insisting on leaving today.  I subsequently spoke with the patient at bedside-he does not wish to remain hospitalized any longer-he claims he is tolerating diet without any nausea and vomiting.  He does not wish to remain hospitalized to wait for cultures.  We will initially plan on discharging him tomorrow morning, but he is now insisting on leaving today.  He is completely awake and alert.  We will discharge him at his own request.  See discharge summary for details.

## 2018-04-24 NOTE — Progress Notes (Signed)
VAST RN to bedside to evaluate need for new PIV. Pt reported he might be able to leave and wants to wait for MD's decision. Bee, RN paged MD and relayed information regarding pt's status. MD stated he was not going to dc him this evening.  VAST RN and unit RN returned to pt's bedside and advised pt of phone call.  VAST RN called pharmacy to see if IV meds could be switched to PO meds. Pharmacy advised that meds had already been switched, with exception of LR.  Pt requested to talk to MD at bedside. He stated he wasn't trying to be difficult to get along with, but did not see the need to stay overnight and is worried about cost d/t lack of insurance. He stated he did not want to be stuck again until speaking with MD at bedside.  VAST RN advised Bee, RN that IVT consult would be removed and if pt needed an IV later, to please place another consult.

## 2018-04-24 NOTE — Progress Notes (Signed)
Per patients request Dr Jerral Ralph came to bedside to speak with patient. Agreed to discharge him. But before he could complete the discharge. Pt had left the room without informing any staff.

## 2018-04-24 NOTE — Progress Notes (Signed)
PROGRESS NOTE        PATIENT DETAILS Name: Jeffrey Harrell Age: 52 y.o. Sex: male Date of Birth: 1965-10-07 Admit Date: 04/22/2018 Admitting Physician Jonah Blue, MD ZOX:[id number], No Pcp Per  Brief Narrative: Patient is a 52 y.o. male with no prior significant medical history presenting with lower abdominal pain, further evaluation revealed sigmoid diverticulitis with a abscess.  Underwent CT-guided drainage on 10/29-improving with supportive care.  Subjective: Hardly any abdominal pain this morning-but did have nausea and vomiting last night.  Assessment/Plan: Sigmoid diverticulitis with abscess: Clinically improved-underwent CT-guided drainage on 10/29-awaiting cultures.  Will narrow antibiotics to Unasyn-if clinical improvement continues suspect can be transitioned to Augmentin.  Diet being slowly advanced by general surgery.    DVT Prophylaxis: Prophylactic Lovenox   Code Status: Full code   Family Communication: None at bedside  Disposition Plan: Remain inpatient-home hopefully on 10/31  Antimicrobial agents: Anti-infectives (From admission, onward)   Start     Dose/Rate Route Frequency Ordered Stop   04/24/18 1000  Ampicillin-Sulbactam (UNASYN) 3 g in sodium chloride 0.9 % 100 mL IVPB     3 g 200 mL/hr over 30 Minutes Intravenous Every 6 hours 04/24/18 0953     04/22/18 1700  piperacillin-tazobactam (ZOSYN) IVPB 3.375 g  Status:  Discontinued     3.375 g 12.5 mL/hr over 240 Minutes Intravenous Every 8 hours 04/22/18 1256 04/24/18 0953   04/22/18 1600  piperacillin-tazobactam (ZOSYN) IVPB 3.375 g  Status:  Discontinued     3.375 g 100 mL/hr over 30 Minutes Intravenous Every 8 hours 04/22/18 1250 04/22/18 1256   04/22/18 1100  piperacillin-tazobactam (ZOSYN) IVPB 3.375 g     3.375 g 100 mL/hr over 30 Minutes Intravenous  Once 04/22/18 1055 04/22/18 1134      Procedures: 10/29>> CT-guided aspiration of pericolonic abscess  CONSULTS:  general surgery and IR  Time spent: 25- minutes-Greater than 50% of this time was spent in counseling, explanation of diagnosis, planning of further management, and coordination of care.  MEDICATIONS: Scheduled Meds: . enoxaparin (LOVENOX) injection  40 mg Subcutaneous Q24H   Continuous Infusions: . ampicillin-sulbactam (UNASYN) IV 3 g (04/24/18 1105)  . lactated ringers 100 mL/hr at 04/24/18 1023   PRN Meds:.acetaminophen **OR** acetaminophen, morphine injection, ondansetron **OR** ondansetron (ZOFRAN) IV, promethazine   PHYSICAL EXAM: Vital signs: Vitals:   04/23/18 2216 04/24/18 0519 04/24/18 0730 04/24/18 1320  BP: 139/85 107/87 129/86 (!) 136/91  Pulse: 88 83 73 76  Resp: 16 16 15 16   Temp: 99.5 F (37.5 C) 98.9 F (37.2 C) 97.8 F (36.6 C) 98.4 F (36.9 C)  TempSrc:   Oral Oral  SpO2: 99% 98% 97% 99%  Weight:      Height:       Filed Weights   04/22/18 2055  Weight: 73.3 kg   Body mass index is 21.92 kg/m.   General appearance :Awake, alert, not in any distress. Speech Clear. Eyes:Pink conjunctiva HEENT: Atraumatic and Normocephalic Neck: supple Resp:Good air entry bilaterally, no added sounds  CVS: S1 S2 regular, no murmurs.  GI: Bowel sounds present, Non tender and not distended with no gaurding, rigidity or rebound.No organomegaly Extremities: B/L Lower Ext shows no edema, both legs are warm to touch Neurology:  speech clear,Non focal, sensation is grossly intact. Musculoskeletal:No digital cyanosis Skin:No Rash, warm and dry Wounds:N/A  I have personally reviewed  following labs and imaging studies  LABORATORY DATA: CBC: Recent Labs  Lab 04/22/18 0820 04/23/18 0549 04/24/18 0335  WBC 13.9* 13.2* 10.0  HGB 14.1 12.6* 12.8*  HCT 43.0 39.1 37.7*  MCV 93.5 92.0 89.5  PLT 287 253 236    Basic Metabolic Panel: Recent Labs  Lab 04/22/18 0820 04/23/18 0549 04/24/18 0335  NA 138 137 135  K 3.6 3.9 3.6  CL 104 101 100  CO2 25 26 27     GLUCOSE 112* 95 105*  BUN 13 12 8   CREATININE 0.91 0.89 0.85  CALCIUM 9.4 9.1 8.9    GFR: Estimated Creatinine Clearance: 105.4 mL/min (by C-G formula based on SCr of 0.85 mg/dL).  Liver Function Tests: Recent Labs  Lab 04/22/18 0820  AST 22  ALT 30  ALKPHOS 70  BILITOT 0.7  PROT 7.1  ALBUMIN 3.7   Recent Labs  Lab 04/22/18 0820  LIPASE 26   No results for input(s): AMMONIA in the last 168 hours.  Coagulation Profile: Recent Labs  Lab 04/23/18 0549  INR 1.12    Cardiac Enzymes: No results for input(s): CKTOTAL, CKMB, CKMBINDEX, TROPONINI in the last 168 hours.  BNP (last 3 results) No results for input(s): PROBNP in the last 8760 hours.  HbA1C: No results for input(s): HGBA1C in the last 72 hours.  CBG: No results for input(s): GLUCAP in the last 168 hours.  Lipid Profile: No results for input(s): CHOL, HDL, LDLCALC, TRIG, CHOLHDL, LDLDIRECT in the last 72 hours.  Thyroid Function Tests: No results for input(s): TSH, T4TOTAL, FREET4, T3FREE, THYROIDAB in the last 72 hours.  Anemia Panel: No results for input(s): VITAMINB12, FOLATE, FERRITIN, TIBC, IRON, RETICCTPCT in the last 72 hours.  Urine analysis:    Component Value Date/Time   COLORURINE YELLOW 04/22/2018 0818   APPEARANCEUR CLEAR 04/22/2018 0818   LABSPEC 1.021 04/22/2018 0818   PHURINE 5.0 04/22/2018 0818   GLUCOSEU NEGATIVE 04/22/2018 0818   HGBUR LARGE (A) 04/22/2018 0818   BILIRUBINUR NEGATIVE 04/22/2018 0818   KETONESUR NEGATIVE 04/22/2018 0818   PROTEINUR NEGATIVE 04/22/2018 0818   NITRITE NEGATIVE 04/22/2018 0818   LEUKOCYTESUR NEGATIVE 04/22/2018 0818    Sepsis Labs: Lactic Acid, Venous No results found for: LATICACIDVEN  MICROBIOLOGY: Recent Results (from the past 240 hour(s))  Aerobic/Anaerobic Culture (surgical/deep wound)     Status: None (Preliminary result)   Collection Time: 04/23/18 10:34 AM  Result Value Ref Range Status   Specimen Description ABSCESS ABDOMEN   Final   Special Requests NONE  Final   Gram Stain   Final    ABUNDANT WBC PRESENT, PREDOMINANTLY PMN ABUNDANT GRAM POSITIVE COCCI FEW GRAM POSITIVE RODS FEW GRAM NEGATIVE RODS    Culture   Final    CULTURE REINCUBATED FOR BETTER GROWTH Performed at [facility], [address], [place], [place] [address]    Report Status PENDING  Incomplete    RADIOLOGY STUDIES/RESULTS: Ct Abdomen Pelvis W Contrast  Result Date: 04/22/2018 CLINICAL DATA:  Lower abdominal pain, nausea, vomiting. Occasional bloody stool. EXAM: CT ABDOMEN AND PELVIS WITH CONTRAST TECHNIQUE: Multidetector CT imaging of the abdomen and pelvis was performed using the standard protocol following bolus administration of intravenous contrast. CONTRAST:  OMNIPAQUE IOHEXOL 300 MG/ML  SOLN COMPARISON:  None. FINDINGS: Lower chest: Lung bases are clear. No effusions. Heart is normal size. Hepatobiliary: Scattered low-density lesions throughout the liver most compatible with cysts. No biliary duct dilatation. Gallbladder unremarkable. Pancreas: No focal abnormality or ductal dilatation. Spleen:  No focal abnormality.  Normal size. Adrenals/Urinary Tract: No adrenal abnormality. No focal renal abnormality. No stones or hydronephrosis. Urinary bladder is unremarkable. Stomach/Bowel: Sigmoid diverticulosis. Wall thickening throughout the sigmoid colon. There is inflammation and focal gas and fluid collection along the right lateral sigmoid wall compatible with active diverticulitis with associated abscess. This measures 4.7 x 2.6 cm on image 58 and may extend into the right lateral sigmoid colon wall. Mildly prominent small bowel loops in the left abdomen, likely focal ileus. Stomach unremarkable. Vascular/Lymphatic: No evidence of aneurysm or adenopathy. Reproductive: No visible focal abnormality. Other: No free fluid or free air. Musculoskeletal: No acute bony abnormality. IMPRESSION: Diffuse sigmoid diverticulosis with changes of  active diverticulitis. There is a gas and fluid collection noted along the right lateral sigmoid wall extending into the right lower quadrant measuring 4.7 x 2.6 cm compatible with diverticular abscess. Electronically Signed   By: Charlett Nose M.D.   On: 04/22/2018 10:40   Ct Aspiration  Result Date: 04/23/2018 INDICATION: 52 year old male with a history of diverticular abscess. EXAM: CT-GUIDED ASPIRATION OF PERICOLONIC/INTRAMURAL ABSCESS MEDICATIONS: The patient is currently admitted to the hospital and receiving intravenous antibiotics. The antibiotics were administered within an appropriate time frame prior to the initiation of the procedure. ANESTHESIA/SEDATION: 1.0 mg IV Versed 75 mcg IV Fentanyl Moderate Sedation Time:  10 minutes The patient was continuously monitored during the procedure by the interventional radiology nurse under my direct supervision. COMPLICATIONS: None TECHNIQUE: Informed written consent was obtained from the patient after a thorough discussion of the procedural risks, benefits and alternatives. All questions were addressed. Maximal Sterile Barrier Technique was utilized including caps, mask, sterile gowns, sterile gloves, sterile drape, hand hygiene and skin antiseptic. A timeout was performed prior to the initiation of the procedure. PROCEDURE: The lower abdomen was prepped with chlorhexidine in a sterile fashion, and a sterile drape was applied covering the operative field. A sterile gown and sterile gloves were used for the procedure. Local anesthesia was provided with 1% Lidocaine. Once scout images were acquired, the patient is prepped and draped in the usual sterile fashion. 1% lidocaine was used for local anesthesia. Using CT guidance, 18 gauge needle was advanced to the pericolonic abscess within the pelvis. Aspiration approximately 10 cc of purulent material performed. Sample sent to lab. Needle was withdrawn and a sterile bandage was placed. Patient tolerated the  procedure well and remained hemodynamically stable throughout. No complications were encountered and no significant blood loss. FINDINGS: CT demonstrates small gas and fluid collection associated with the wall of the sigmoid colon with associated phlegmon and. Approximately 10 cc of frankly purulent material aspirated. IMPRESSION: Status post CT-guided aspiration of small pericolonic/intramural abscess with sample sent the lab for analysis. Signed, Yvone Neu. Reyne Dumas, RPVI [facility] Radiology Electronically Signed   By: Gilmer Mor D.O.   On: 04/23/2018 10:28     LOS: 2 days   Jeoffrey Massed, MD  [facility]  If 7PM-7AM, please contact night-coverage  Please page via [email or web address]-click on MD name and type text message  04/24/2018, 2:08 PM

## 2018-04-24 NOTE — Discharge Summary (Signed)
PATIENT DETAILS Name: Jeffrey Harrell Age: 52 y.o. Sex: male Date of Birth: [birth year] MRN: [id number]. Admitting Physician: Jonah Blue, MD WUJ:[id number], No Pcp Per  Admit Date: 04/22/2018 Discharge date: 04/24/2018  Recommendations for Outpatient Follow-up:  1. Follow up with PCP in 1-2 weeks 2. Please obtain BMP/CBC in one week 3. Please follow results of CT-guided abscess culture-pending at the time of discharge.  Admitted From:  Home  Disposition: Home   Home Health: No  Equipment/Devices: None  Discharge Condition: Stable  CODE STATUS: FULL CODE  Diet recommendation:  Soft diet for a week before advancing further.  Brief Summary: See H&P, Labs, Consult and Test reports for all details in brief,Patient is a 52 y.o. male with no prior significant medical history presenting with lower abdominal pain, further evaluation revealed sigmoid diverticulitis with a abscess.  Underwent CT-guided drainage on 10/29.  See below for further details  Brief Hospital Course: Sigmoid diverticulitis with abscess: Initially started on Zosyn-seen by general surgery-underwen-underwent CT-guided drainage on 10/29-awaiting cultures.    Antibiotics was subsequently noted to [facility].  Plans were to monitor for today before considering discharge tomorrow as we advanced diet (patient had vomited yesterday afternoon).  However patient was insisting on leaving the hospital today-this MD came up to the room and spoke with the patient-since he was insistent on being discharged-plans were to discharge him today-he however left before discharge paperwork could be completed and before prescriptions for antibiotics could be given to him.  I subsequently called his number-voicemail not set up, and then called his significant other's cell number and left a message informing them that prescription for Augmentin would be at the nurses station.  Overall much improved-today he has had no vomiting-apparently  tolerated soft diet without any issues.  Have asked him to follow-up with general surgery.   Procedures/Studies: 10/29>> CT-guided aspiration of pericolonic abscess  Discharge Diagnoses:  Principal Problem:   Colonic diverticular abscess Active Problems:   Hyperglycemia   Hematuria   Discharge Instructions:  Activity:  As tolerated   Discharge Instructions    Call MD for:  persistant nausea and vomiting   Complete by:  As directed    Call MD for:  severe uncontrolled pain   Complete by:  As directed    Diet general   Complete by:  As directed    Discharge instructions   Complete by:  As directed    Follow with Primary MD in 1 week  Follow with Gen Surgery in 1 week  Ask your Primary MD to follow abscess cultures  Please get a complete blood count and chemistry panel checked by your Primary MD at your next visit, and again as instructed by your Primary MD.  Get Medicines reviewed and adjusted: Please take all your medications with you for your next visit with your Primary MD  Laboratory/radiological data: Please request your Primary MD to go over all hospital tests and procedure/radiological results at the follow up, please ask your Primary MD to get all Hospital records sent to his/her office.  In some cases, they will be blood work, cultures and biopsy results pending at the time of your discharge. Please request that your primary care M.D. follows up on these results.  Also Note the following: If you experience worsening of your admission symptoms, develop shortness of breath, life threatening emergency, suicidal or homicidal thoughts you must seek medical attention immediately by calling 911 or calling your MD immediately  if symptoms less severe.  You must  read complete instructions/literature along with all the possible adverse reactions/side effects for all the Medicines you take and that have been prescribed to you. Take any new Medicines after you have  completely understood and accpet all the possible adverse reactions/side effects.   Do not drive when taking Pain medications or sleeping medications (Benzodaizepines)  Do not take more than prescribed Pain, Sleep and Anxiety Medications. It is not advisable to combine anxiety,sleep and pain medications without talking with your primary care practitioner  Special Instructions: If you have smoked or chewed Tobacco  in the last 2 yrs please stop smoking, stop any regular Alcohol  and or any Recreational drug use.  Wear Seat belts while driving.  Please note: You were cared for by a hospitalist during your hospital stay. Once you are discharged, your primary care physician will handle any further medical issues. Please note that NO REFILLS for any discharge medications will be authorized once you are discharged, as it is imperative that you return to your primary care physician (or establish a relationship with a primary care physician if you do not have one) for your post hospital discharge needs so that they can reassess your need for medications and monitor your lab values.   Increase activity slowly   Complete by:  As directed      Allergies as of 04/24/2018      Reactions   Horseradish [armoracia Rusticana Ext (horseradish)] Shortness Of Breath, Rash   Hydrocodone Other (See Comments)   Hallucination      Medication List    STOP taking these medications   ibuprofen 200 MG tablet Commonly known as:  ADVIL,MOTRIN   OIL OF OREGANO PO     TAKE these medications   amoxicillin-clavulanate 875-125 MG tablet Commonly known as:  AUGMENTIN Take 1 tablet by mouth every 12 (twelve) hours.      Follow-up Information    [facility]. Schedule an appointment as soon as possible for a visit in 1 week(s).   Contact information: [address] [place] [address]       Primary MD. Schedule an appointment as soon as possible for a  visit in 1 week(s).          Allergies  Allergen Reactions  . Horseradish [Armoracia Rusticana Ext (Horseradish)] Shortness Of Breath and Rash  . Hydrocodone Other (See Comments)    Hallucination    Consultations:   general surgery  Other Procedures/Studies: Ct Abdomen Pelvis W Contrast  Result Date: 04/22/2018 CLINICAL DATA:  Lower abdominal pain, nausea, vomiting. Occasional bloody stool. EXAM: CT ABDOMEN AND PELVIS WITH CONTRAST TECHNIQUE: Multidetector CT imaging of the abdomen and pelvis was performed using the standard protocol following bolus administration of intravenous contrast. CONTRAST:  OMNIPAQUE IOHEXOL 300 MG/ML  SOLN COMPARISON:  None. FINDINGS: Lower chest: Lung bases are clear. No effusions. Heart is normal size. Hepatobiliary: Scattered low-density lesions throughout the liver most compatible with cysts. No biliary duct dilatation. Gallbladder unremarkable. Pancreas: No focal abnormality or ductal dilatation. Spleen: No focal abnormality.  Normal size. Adrenals/Urinary Tract: No adrenal abnormality. No focal renal abnormality. No stones or hydronephrosis. Urinary bladder is unremarkable. Stomach/Bowel: Sigmoid diverticulosis. Wall thickening throughout the sigmoid colon. There is inflammation and focal gas and fluid collection along the right lateral sigmoid wall compatible with active diverticulitis with associated abscess. This measures 4.7 x 2.6 cm on image 58 and may extend into the right lateral sigmoid colon wall. Mildly prominent small  bowel loops in the left abdomen, likely focal ileus. Stomach unremarkable. Vascular/Lymphatic: No evidence of aneurysm or adenopathy. Reproductive: No visible focal abnormality. Other: No free fluid or free air. Musculoskeletal: No acute bony abnormality. IMPRESSION: Diffuse sigmoid diverticulosis with changes of active diverticulitis. There is a gas and fluid collection noted along the right lateral sigmoid wall extending into the  right lower quadrant measuring 4.7 x 2.6 cm compatible with diverticular abscess. Electronically Signed   By: Charlett Nose M.D.   On: 04/22/2018 10:40   Ct Aspiration  Result Date: 04/23/2018 INDICATION: 52 year old male with a history of diverticular abscess. EXAM: CT-GUIDED ASPIRATION OF PERICOLONIC/INTRAMURAL ABSCESS MEDICATIONS: The patient is currently admitted to the hospital and receiving intravenous antibiotics. The antibiotics were administered within an appropriate time frame prior to the initiation of the procedure. ANESTHESIA/SEDATION: 1.0 mg IV Versed 75 mcg IV Fentanyl Moderate Sedation Time:  10 minutes The patient was continuously monitored during the procedure by the interventional radiology nurse under my direct supervision. COMPLICATIONS: None TECHNIQUE: Informed written consent was obtained from the patient after a thorough discussion of the procedural risks, benefits and alternatives. All questions were addressed. Maximal Sterile Barrier Technique was utilized including caps, mask, sterile gowns, sterile gloves, sterile drape, hand hygiene and skin antiseptic. A timeout was performed prior to the initiation of the procedure. PROCEDURE: The lower abdomen was prepped with chlorhexidine in a sterile fashion, and a sterile drape was applied covering the operative field. A sterile gown and sterile gloves were used for the procedure. Local anesthesia was provided with 1% Lidocaine. Once scout images were acquired, the patient is prepped and draped in the usual sterile fashion. 1% lidocaine was used for local anesthesia. Using CT guidance, 18 gauge needle was advanced to the pericolonic abscess within the pelvis. Aspiration approximately 10 cc of purulent material performed. Sample sent to lab. Needle was withdrawn and a sterile bandage was placed. Patient tolerated the procedure well and remained hemodynamically stable throughout. No complications were encountered and no significant blood loss.  FINDINGS: CT demonstrates small gas and fluid collection associated with the wall of the sigmoid colon with associated phlegmon and. Approximately 10 cc of frankly purulent material aspirated. IMPRESSION: Status post CT-guided aspiration of small pericolonic/intramural abscess with sample sent the lab for analysis. Signed, Yvone Neu. Reyne Dumas, RPVI [facility] Radiology Electronically Signed   By: Gilmer Mor D.O.   On: 04/23/2018 10:28      TODAY-DAY OF DISCHARGE:  Subjective:   Chyrel Masson today has no headache,no chest abdominal pain,no new weakness tingling or numbness, feels much better wants to go home today.   Objective:   Blood pressure (!) 136/91, pulse 76, temperature 98.4 F (36.9 C), temperature source Oral, resp. rate 16, height 6' (1.829 m), weight 73.3 kg, SpO2 99 %. No intake or output data in the 24 hours ending 04/24/18 1647 Filed Weights   04/22/18 2055  Weight: 73.3 kg    Exam: Awake Alert, Oriented *3, No new F.N deficits, Normal affect [place].AT,PERRAL Supple Neck,No JVD, No cervical lymphadenopathy appriciated.  Symmetrical Chest wall movement, Good air movement bilaterally, CTAB RRR,No Gallops,Rubs or new Murmurs, No Parasternal Heave +ve B.Sounds, Abd Soft, Non tender, No organomegaly appriciated, No rebound -guarding or rigidity. No Cyanosis, Clubbing or edema, No new Rash or bruise   PERTINENT RADIOLOGIC STUDIES: Ct Abdomen Pelvis W Contrast  Result Date: 04/22/2018 CLINICAL DATA:  Lower abdominal pain, nausea, vomiting. Occasional bloody stool. EXAM: CT ABDOMEN AND  PELVIS WITH CONTRAST TECHNIQUE: Multidetector CT imaging of the abdomen and pelvis was performed using the standard protocol following bolus administration of intravenous contrast. CONTRAST:  OMNIPAQUE IOHEXOL 300 MG/ML  SOLN COMPARISON:  None. FINDINGS: Lower chest: Lung bases are clear. No effusions. Heart is normal size. Hepatobiliary:  Scattered low-density lesions throughout the liver most compatible with cysts. No biliary duct dilatation. Gallbladder unremarkable. Pancreas: No focal abnormality or ductal dilatation. Spleen: No focal abnormality.  Normal size. Adrenals/Urinary Tract: No adrenal abnormality. No focal renal abnormality. No stones or hydronephrosis. Urinary bladder is unremarkable. Stomach/Bowel: Sigmoid diverticulosis. Wall thickening throughout the sigmoid colon. There is inflammation and focal gas and fluid collection along the right lateral sigmoid wall compatible with active diverticulitis with associated abscess. This measures 4.7 x 2.6 cm on image 58 and may extend into the right lateral sigmoid colon wall. Mildly prominent small bowel loops in the left abdomen, likely focal ileus. Stomach unremarkable. Vascular/Lymphatic: No evidence of aneurysm or adenopathy. Reproductive: No visible focal abnormality. Other: No free fluid or free air. Musculoskeletal: No acute bony abnormality. IMPRESSION: Diffuse sigmoid diverticulosis with changes of active diverticulitis. There is a gas and fluid collection noted along the right lateral sigmoid wall extending into the right lower quadrant measuring 4.7 x 2.6 cm compatible with diverticular abscess. Electronically Signed   By: Charlett Nose M.D.   On: 04/22/2018 10:40   Ct Aspiration  Result Date: 04/23/2018 INDICATION: 52 year old male with a history of diverticular abscess. EXAM: CT-GUIDED ASPIRATION OF PERICOLONIC/INTRAMURAL ABSCESS MEDICATIONS: The patient is currently admitted to the hospital and receiving intravenous antibiotics. The antibiotics were administered within an appropriate time frame prior to the initiation of the procedure. ANESTHESIA/SEDATION: 1.0 mg IV Versed 75 mcg IV Fentanyl Moderate Sedation Time:  10 minutes The patient was continuously monitored during the procedure by the interventional radiology nurse under my direct supervision. COMPLICATIONS: None  TECHNIQUE: Informed written consent was obtained from the patient after a thorough discussion of the procedural risks, benefits and alternatives. All questions were addressed. Maximal Sterile Barrier Technique was utilized including caps, mask, sterile gowns, sterile gloves, sterile drape, hand hygiene and skin antiseptic. A timeout was performed prior to the initiation of the procedure. PROCEDURE: The lower abdomen was prepped with chlorhexidine in a sterile fashion, and a sterile drape was applied covering the operative field. A sterile gown and sterile gloves were used for the procedure. Local anesthesia was provided with 1% Lidocaine. Once scout images were acquired, the patient is prepped and draped in the usual sterile fashion. 1% lidocaine was used for local anesthesia. Using CT guidance, 18 gauge needle was advanced to the pericolonic abscess within the pelvis. Aspiration approximately 10 cc of purulent material performed. Sample sent to lab. Needle was withdrawn and a sterile bandage was placed. Patient tolerated the procedure well and remained hemodynamically stable throughout. No complications were encountered and no significant blood loss. FINDINGS: CT demonstrates small gas and fluid collection associated with the wall of the sigmoid colon with associated phlegmon and. Approximately 10 cc of frankly purulent material aspirated. IMPRESSION: Status post CT-guided aspiration of small pericolonic/intramural abscess with sample sent the lab for analysis. Signed, Yvone Neu. Reyne Dumas, RPVI [facility] Radiology Electronically Signed   By: Gilmer Mor D.O.   On: 04/23/2018 10:28     PERTINENT LAB RESULTS: CBC: Recent Labs    04/23/18 0549 04/24/18 0335  WBC 13.2* 10.0  HGB 12.6* 12.8*  HCT 39.1 37.7*  PLT  253 236   CMET CMP     Component Value Date/Time   NA 135 04/24/2018 0335   K 3.6 04/24/2018 0335   CL 100 04/24/2018 0335   CO2 27  04/24/2018 0335   GLUCOSE 105 (H) 04/24/2018 0335   BUN 8 04/24/2018 0335   CREATININE 0.85 04/24/2018 0335   CALCIUM 8.9 04/24/2018 0335   PROT 7.1 04/22/2018 0820   ALBUMIN 3.7 04/22/2018 0820   AST 22 04/22/2018 0820   ALT 30 04/22/2018 0820   ALKPHOS 70 04/22/2018 0820   BILITOT 0.7 04/22/2018 0820   GFRNONAA >60 04/24/2018 0335   GFRAA >60 04/24/2018 0335    GFR Estimated Creatinine Clearance: 105.4 mL/min (by C-G formula based on SCr of 0.85 mg/dL). Recent Labs    04/22/18 0820  LIPASE 26   No results for input(s): CKTOTAL, CKMB, CKMBINDEX, TROPONINI in the last 72 hours. Invalid input(s): POCBNP No results for input(s): DDIMER in the last 72 hours. No results for input(s): HGBA1C in the last 72 hours. No results for input(s): CHOL, HDL, LDLCALC, TRIG, CHOLHDL, LDLDIRECT in the last 72 hours. No results for input(s): TSH, T4TOTAL, T3FREE, THYROIDAB in the last 72 hours.  Invalid input(s): FREET3 No results for input(s): VITAMINB12, FOLATE, FERRITIN, TIBC, IRON, RETICCTPCT in the last 72 hours. Coags: Recent Labs    04/23/18 0549  INR 1.12   Microbiology: Recent Results (from the past 240 hour(s))  Aerobic/Anaerobic Culture (surgical/deep wound)     Status: None (Preliminary result)   Collection Time: 04/23/18 10:34 AM  Result Value Ref Range Status   Specimen Description ABSCESS ABDOMEN  Final   Special Requests NONE  Final   Gram Stain   Final    ABUNDANT WBC PRESENT, PREDOMINANTLY PMN ABUNDANT GRAM POSITIVE COCCI FEW GRAM POSITIVE RODS FEW GRAM NEGATIVE RODS    Culture   Final    CULTURE REINCUBATED FOR BETTER GROWTH Performed at [facility], [address], [place], [place] [address]    Report Status PENDING  Incomplete    FURTHER DISCHARGE INSTRUCTIONS:  Get Medicines reviewed and adjusted: Please take all your medications with you for your next visit with your Primary MD  Laboratory/radiological data: Please request your Primary MD to  go over all hospital tests and procedure/radiological results at the follow up, please ask your Primary MD to get all Hospital records sent to his/her office.  In some cases, they will be blood work, cultures and biopsy results pending at the time of your discharge. Please request that your primary care M.D. goes through all the records of your hospital data and follows up on these results.  Also Note the following: If you experience worsening of your admission symptoms, develop shortness of breath, life threatening emergency, suicidal or homicidal thoughts you must seek medical attention immediately by calling 911 or calling your MD immediately  if symptoms less severe.  You must read complete instructions/literature along with all the possible adverse reactions/side effects for all the Medicines you take and that have been prescribed to you. Take any new Medicines after you have completely understood and accpet all the possible adverse reactions/side effects.   Do not drive when taking Pain medications or sleeping medications (Benzodaizepines)  Do not take more than prescribed Pain, Sleep and Anxiety Medications. It is not advisable to combine anxiety,sleep and pain medications without talking with your primary care practitioner  Special Instructions: If you have smoked or chewed Tobacco  in the last 2 yrs please  stop smoking, stop any regular Alcohol  and or any Recreational drug use.  Wear Seat belts while driving.  Please note: You were cared for by a hospitalist during your hospital stay. Once you are discharged, your primary care physician will handle any further medical issues. Please note that NO REFILLS for any discharge medications will be authorized once you are discharged, as it is imperative that you return to your primary care physician (or establish a relationship with a primary care physician if you do not have one) for your post hospital discharge needs so that they can reassess  your need for medications and monitor your lab values.  Total Time spent coordinating discharge including counseling, education and face to face time equals 25 minutes.  SignedJeoffrey Massed 04/24/2018 4:47 PM

## 2018-04-24 NOTE — Progress Notes (Signed)
Before discharge process could be done-patient already left the room without informing the nursing staff or myself.  I called his cell number-voicemail is not set up, I called his significant other's cell-and have left a message-indicating that prescription for Augmentin would be left at the nurses station so that they can come back and pick it up.

## 2018-04-26 LAB — AEROBIC/ANAEROBIC CULTURE W GRAM STAIN (SURGICAL/DEEP WOUND)

## 2018-04-26 LAB — AEROBIC/ANAEROBIC CULTURE (SURGICAL/DEEP WOUND)

## 2018-04-26 NOTE — Progress Notes (Signed)
Addendum 04/26/2018 at 4:14 PM.  Informed by our pharmacy team that post discharge-cultures have grown E. coli that is resistant to Augmentin, and also apparently has grown anaerobic flora as well.  Have called in Keflex to his [facility] pharmacy.  He is to continue taking Augmentin for anaerobic coverage (he has already filled his prescription).  I tried getting in touch with the patient-his number goes directly to voicemail that is not set up, I subsequently spoke to his significant other-I have asked her to relay this message to the patient.

## 2018-04-28 ENCOUNTER — Emergency Department (HOSPITAL_COMMUNITY)
Admission: EM | Admit: 2018-04-28 | Discharge: 2018-04-28 | Disposition: A | Discharge status: Home / Self Care | Payer: Self-pay | Attending: Emergency Medicine | Admitting: Emergency Medicine

## 2018-04-28 ENCOUNTER — Encounter (HOSPITAL_COMMUNITY): Payer: Self-pay | Admitting: Emergency Medicine

## 2018-04-28 DIAGNOSIS — R21 Rash and other nonspecific skin eruption: Secondary | ICD-10-CM | POA: Insufficient documentation

## 2018-04-28 DIAGNOSIS — F17200 Nicotine dependence, unspecified, uncomplicated: Secondary | ICD-10-CM | POA: Insufficient documentation

## 2018-04-28 DIAGNOSIS — M79601 Pain in right arm: Secondary | ICD-10-CM | POA: Insufficient documentation

## 2018-04-28 NOTE — ED Provider Notes (Signed)
Emergency Department Provider Note   I have reviewed the triage vital signs and the nursing notes.   HISTORY  Chief Complaint Arm Pain   HPI Jeffrey Harrell is a 52 y.o. male with PMH of recent admit for diverticulitis resents to the emergency department for evaluation of right arm pain.  The patient states he was recently discharged after an admit for diverticulitis.  He was managed on antibiotics and states that toward the end of his hospital stay he is IV in the right arm infiltrated and it was removed.  They were unable to secure additional IV access.  The patient states he is feeling better and was discharged on Augmentin, which he has been taking.  Since discharge has had some redness in the area with pain and some swelling.  Denies any fevers or shaking chills.  His abdominal pain is improving significantly.   Past Medical History:  Diagnosis Date  . Diverticulitis     Patient Active Problem List   Diagnosis Date Noted  . Colonic diverticular abscess 04/22/2018  . Hyperglycemia 04/22/2018  . Hematuria 04/22/2018    Past Surgical History:  Procedure Laterality Date  . ABDOMINAL SURGERY  2009    Current Outpatient Rx  . Order #: [id number] Class: Print    Allergies Horseradish [armoracia rusticana ext (horseradish)] and Hydrocodone  No family history on file.  Social History Social History   Tobacco Use  . Smoking status: Current Some Day Smoker  . Smokeless tobacco: Never Used  Substance Use Topics  . Alcohol use: Yes    Comment: occ  . Drug use: Yes    Types: Marijuana    Comment: last use Saturday    Review of Systems  Constitutional: No fever/chills Eyes: No visual changes. ENT: No sore throat. Cardiovascular: Denies chest pain. Respiratory: Denies shortness of breath. Gastrointestinal: No abdominal pain.  No nausea, no vomiting.  No diarrhea.  No constipation. Genitourinary: Negative for dysuria. Musculoskeletal: Negative for back pain. Skin:  Right arm redness.  Neurological: Negative for headaches, focal weakness or numbness.  10-point ROS otherwise negative.  ____________________________________________   PHYSICAL EXAM:  VITAL SIGNS: ED Triage Vitals  Enc Vitals Group     BP 04/28/18 1402 (!) 143/87     Pulse Rate 04/28/18 1402 81     Resp 04/28/18 1402 15     Temp 04/28/18 1402 98.3 F (36.8 C)     Temp Source 04/28/18 1402 Oral     SpO2 04/28/18 1402 98 %     Pain Score 04/28/18 1400 4   Constitutional: Alert and oriented. Well appearing and in no acute distress. Eyes: Conjunctivae are normal.  Head: Atraumatic. Nose: No congestion/rhinnorhea. Mouth/Throat: Mucous membranes are moist.  Oropharynx non-erythematous. Neck: No stridor.  Cardiovascular: Normal rate, regular rhythm. Good peripheral circulation. Grossly normal heart sounds.   Respiratory: Normal respiratory effort.  No retractions. Lungs CTAB. Gastrointestinal: Soft and nontender. No distention.  Musculoskeletal: No lower extremity tenderness nor edema. No gross deformities of extremities. Neurologic:  Normal speech and language. No gross focal neurologic deficits are appreciated.  Skin:  Skin is warm, dry and intact. Mild erythema over the right medial elbow. Normal ROM of the elbow. No warmth or appreciated fluctuance. Mild tenderness. In induration.   ____________________________________________  RADIOLOGY  None ____________________________________________   PROCEDURES  Procedure(s) performed:   Procedures  None ____________________________________________   INITIAL IMPRESSION / ASSESSMENT AND PLAN / ED COURSE  Pertinent labs & imaging results that were available during my  care of the patient were reviewed by me and considered in my medical decision making (see chart for details).  She presents to the emergency department for evaluation of right arm redness.  Symptoms have improved throughout the day today.  He is not having any  constitutional symptoms.  No concern for septic joint.  Very low suspicion for cellulitis.  I suspect he is having some inflammation related to IV infiltration during his recent admission.  He was not on any vasoactive medications.  He has been taking Augmentin at home.  Advised cool compress to the site, Tylenol/Motrin for pain, and close PCP follow-up.  He will return if symptoms worsen.    ____________________________________________  FINAL CLINICAL IMPRESSION(S) / ED DIAGNOSES  Final diagnoses:  Right arm pain  Rash    Note:  This document was prepared using Dragon voice recognition software and may include unintentional dictation errors.  Alona Bene, MD Emergency Medicine    Lucero Ide, Arlyss Repress, MD 04/28/18 [phone]

## 2018-04-28 NOTE — Progress Notes (Signed)
Patient called regarding right ac feeling hard, red, and swollen. I talked to [place], RN who was here that day and we advised him to come to the emergency department or to an urgent care to have the site assessed by a doctor.

## 2018-04-28 NOTE — ED Triage Notes (Signed)
Pt states he was in the hospital earlier this week for diverticulitis, states that his iv was in the R arm. States that he had been getting iv abx in his R arm iv, reports abx finished and the next morning when they came in to check his iv it was not working so it had to be removed. Now having redness and swelling around area of iv site that began later that day and has gotten worse.

## 2018-04-28 NOTE — Discharge Instructions (Signed)
You were seen in the ED today with right arm redness. This does not appear to be infection. Continue your Augmentin and follow up with the PCP. Return to the ED with any worsening redness, pain, swelling, or fever.

## 2019-04-12 IMAGING — CT CT ABD-PELV W/ CM
2 of 5 series · 17 of 46 positions shown, 19 images · IV contrast (APPLIED)
Comparison: None.

CLINICAL DATA: Lower abdominal pain, nausea, vomiting. Occasional
bloody stool.

EXAM:
CT ABDOMEN AND PELVIS WITH CONTRAST
TECHNIQUE: Multidetector CT imaging of the abdomen and pelvis was performed
using the standard protocol following bolus administration of
intravenous contrast.
CONTRAST:  100mL OMNIPAQUE IOHEXOL 300 MG/ML  SOLN

[Series 3: abd/ pelvis 5.0 i30f 2 · axial · 0.81mm/px · z∈[-775,-405]mm · 14 of 84 slices shown, 16 images]
[im 5/84  soft-tissue]
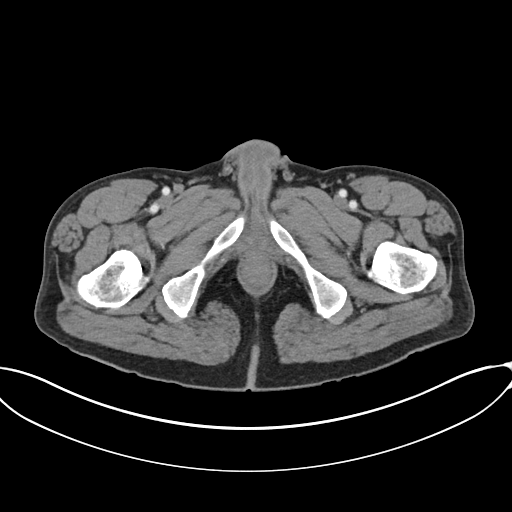
[im 5/84  bone]
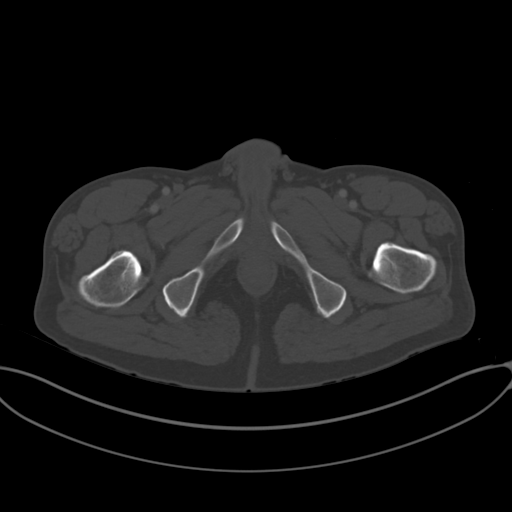
[im 10/84  soft-tissue]
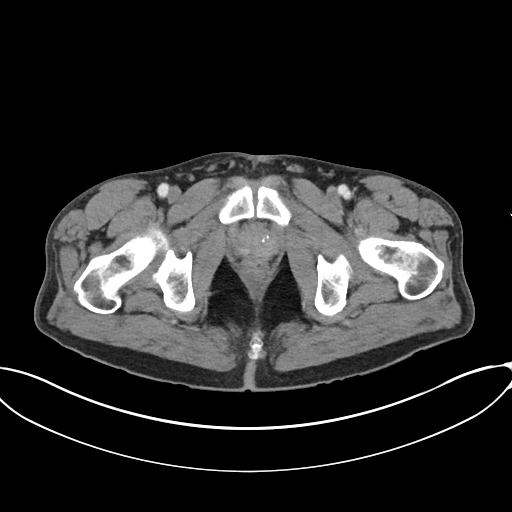
[im 19/84  soft-tissue]
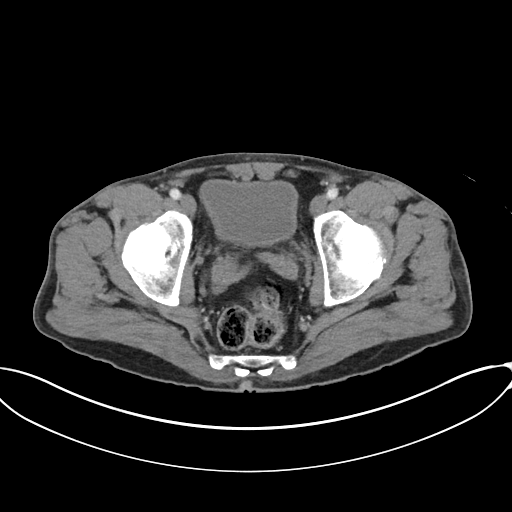
[im 24/84  soft-tissue]
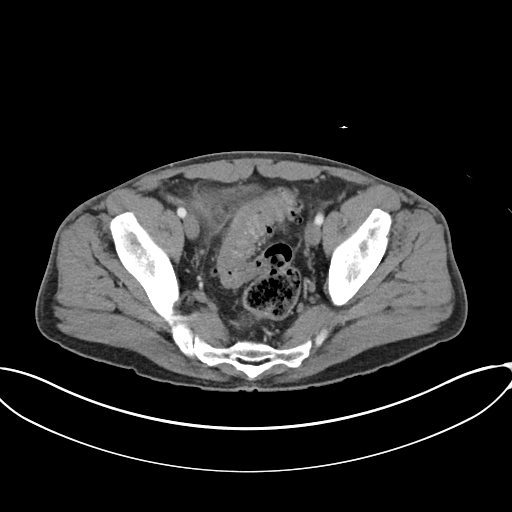
[im 28/84  soft-tissue]
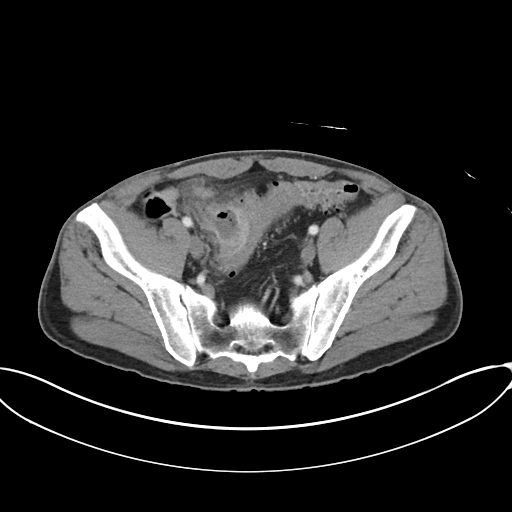
[im 33/84  soft-tissue]
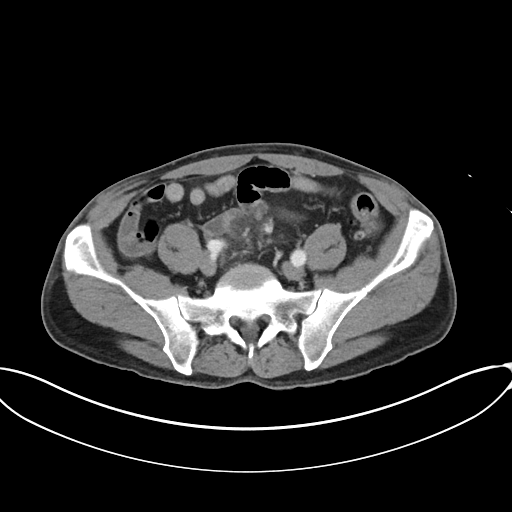
[im 37/84  soft-tissue]
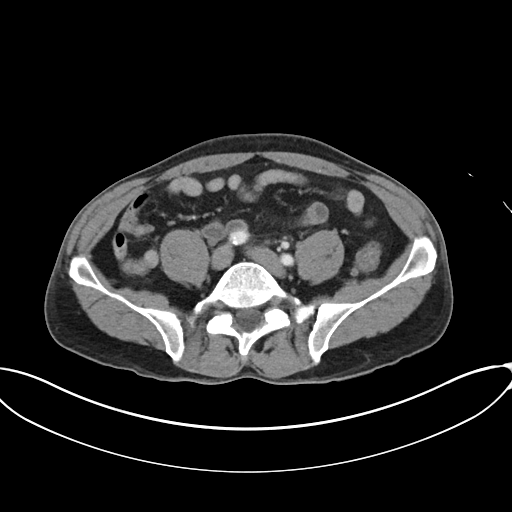
[im 47/84  soft-tissue]
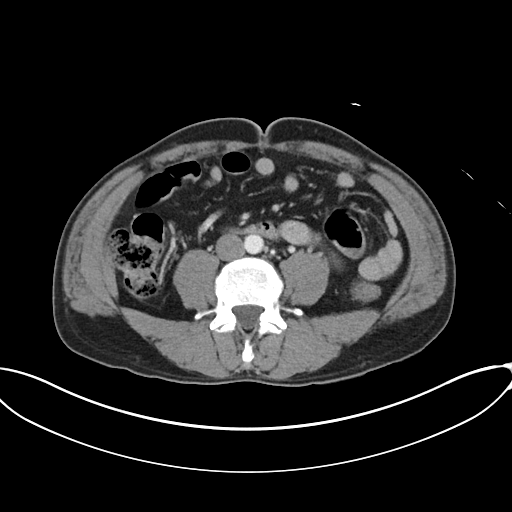
[im 51/84  soft-tissue]
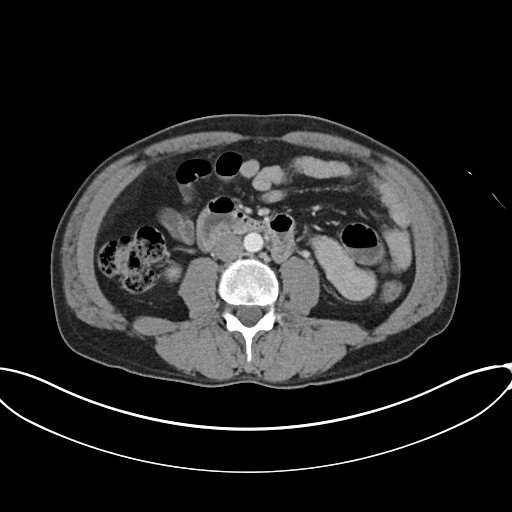
[im 51/84  bone]
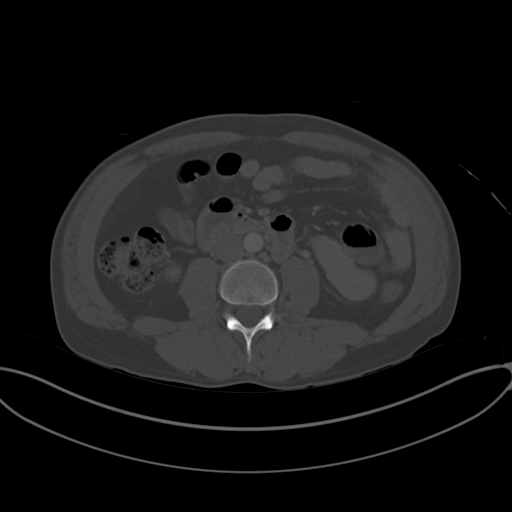
[im 56/84  soft-tissue]
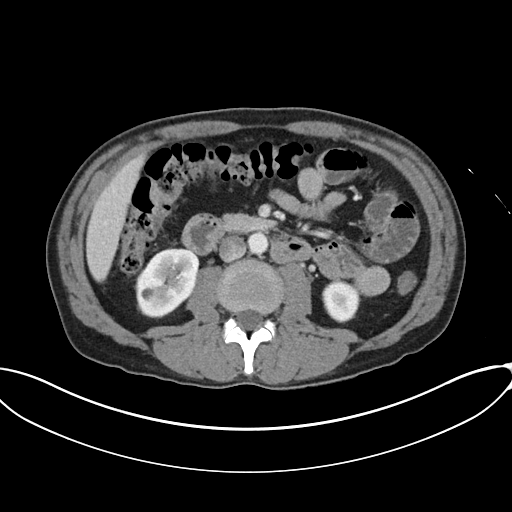
[im 60/84  soft-tissue]
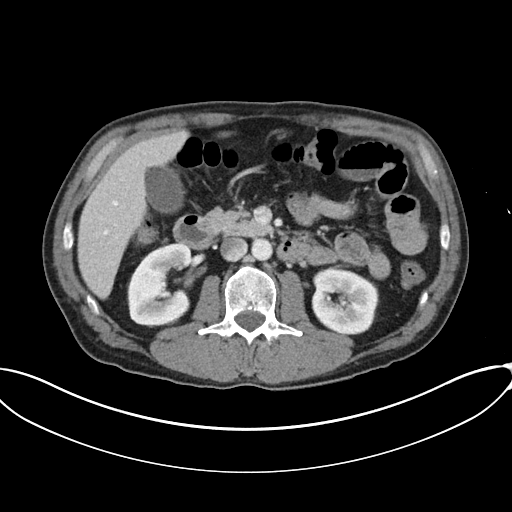
[im 65/84  soft-tissue]
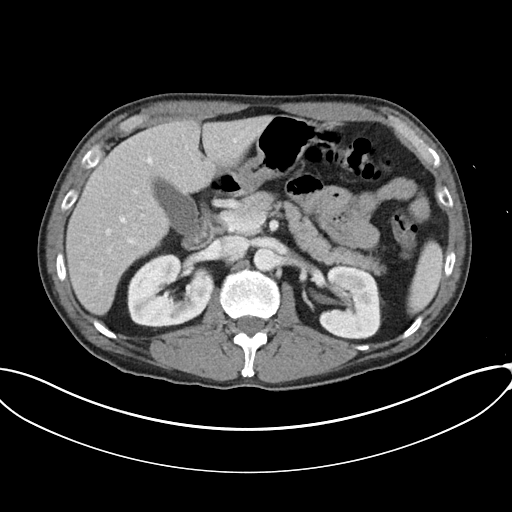
[im 74/84  soft-tissue]
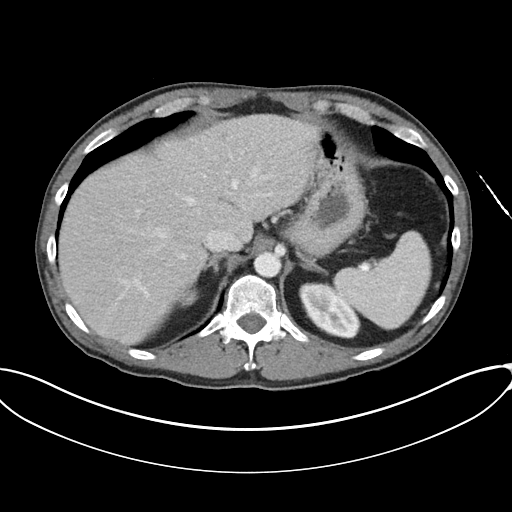
[im 79/84  soft-tissue]
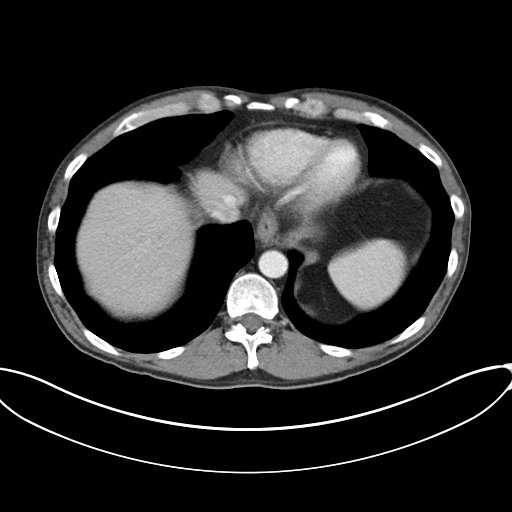

[Series 6: coronal soft tissue · coronal · 0.80mm/px · 3 of 95 slices shown]
[im 32/95  soft-tissue]
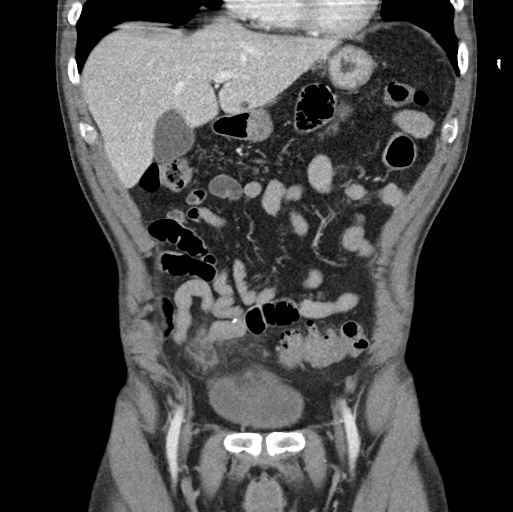
[im 42/95  soft-tissue]
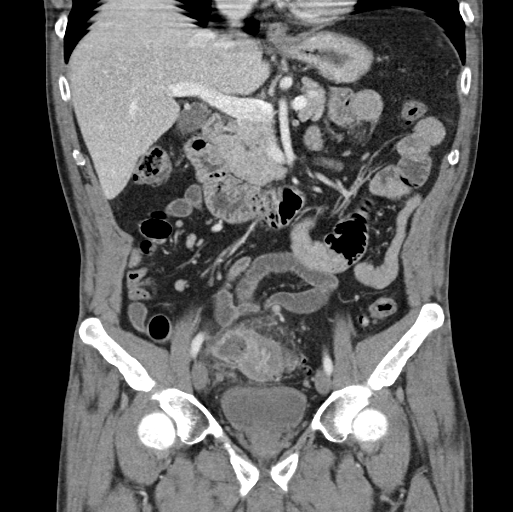
[im 53/95  soft-tissue]
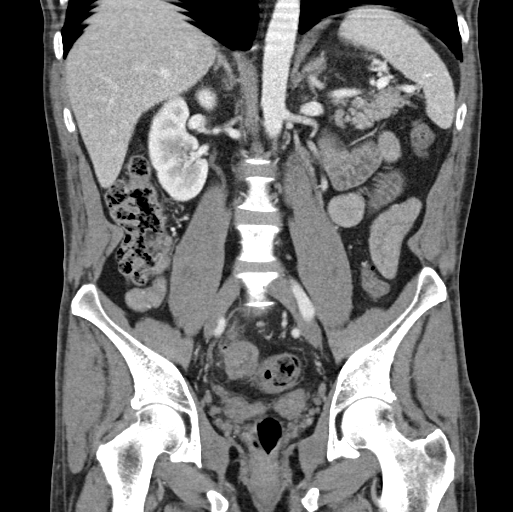

[17 of 46 positions shown; findings below may reference images not displayed]

FINDINGS: Lower chest: Lung bases are clear. No effusions. Heart is normal
size.

Hepatobiliary: Scattered low-density lesions throughout the liver
most compatible with cysts. No biliary duct dilatation. Gallbladder
unremarkable.

Pancreas: No focal abnormality or ductal dilatation.

Spleen: No focal abnormality.  Normal size.

Adrenals/Urinary Tract: No adrenal abnormality. No focal renal
abnormality. No stones or hydronephrosis. Urinary bladder is
unremarkable.

Stomach/Bowel: Sigmoid diverticulosis. Wall thickening throughout
the sigmoid colon. There is inflammation and focal gas and fluid
collection along the right lateral sigmoid wall compatible with
active diverticulitis with associated abscess. This measures 4.7 x
2.6 cm on image 58 and may extend into the right lateral sigmoid
colon wall. Mildly prominent small bowel loops in the left abdomen,
likely focal ileus. Stomach unremarkable.

Vascular/Lymphatic: No evidence of aneurysm or adenopathy.

Reproductive: No visible focal abnormality.

Other: No free fluid or free air.

Musculoskeletal: No acute bony abnormality.
IMPRESSION: Diffuse sigmoid diverticulosis with changes of active
diverticulitis. There is a gas and fluid collection noted along the
right lateral sigmoid wall extending into the right lower quadrant
measuring 4.7 x 2.6 cm compatible with diverticular abscess.
